# Patient Record
Sex: Female | Born: 1965 | Race: White | Hispanic: No | Marital: Married | Smoking: Never smoker
Health system: Southern US, Community
[De-identification: ages and names within clinical notes are randomized; demographics above are authoritative.]

## PROBLEM LIST (undated history)

## (undated) DIAGNOSIS — K589 Irritable bowel syndrome without diarrhea: Secondary | ICD-10-CM

## (undated) DIAGNOSIS — D229 Melanocytic nevi, unspecified: Secondary | ICD-10-CM

## (undated) DIAGNOSIS — Z98891 History of uterine scar from previous surgery: Secondary | ICD-10-CM

## (undated) DIAGNOSIS — IMO0002 Reserved for concepts with insufficient information to code with codable children: Secondary | ICD-10-CM

## (undated) DIAGNOSIS — K219 Gastro-esophageal reflux disease without esophagitis: Secondary | ICD-10-CM

## (undated) HISTORY — DX: Irritable bowel syndrome, unspecified: K58.9

## (undated) HISTORY — PX: TONSILLECTOMY: SUR1361

## (undated) HISTORY — DX: Gastro-esophageal reflux disease without esophagitis: K21.9

## (undated) HISTORY — DX: Reserved for concepts with insufficient information to code with codable children: IMO0002

## (undated) HISTORY — PX: NASAL SEPTUM SURGERY: SHX37

## (undated) HISTORY — PX: DILATION AND CURETTAGE OF UTERUS: SHX78

---
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
## 1997-12-24 ENCOUNTER — Ambulatory Visit (HOSPITAL_COMMUNITY): Admission: RE | Admit: 1997-12-24 | Discharge: 1997-12-24 | Payer: Self-pay | Admitting: Obstetrics and Gynecology

## 1998-02-09 ENCOUNTER — Ambulatory Visit (HOSPITAL_COMMUNITY): Admission: RE | Admit: 1998-02-09 | Discharge: 1998-02-09 | Payer: Self-pay | Admitting: Obstetrics and Gynecology

## 1998-02-11 ENCOUNTER — Inpatient Hospital Stay (HOSPITAL_COMMUNITY): Admission: AD | Admit: 1998-02-11 | Discharge: 1998-02-11 | Payer: Self-pay | Admitting: Obstetrics and Gynecology

## 1998-02-13 ENCOUNTER — Inpatient Hospital Stay (HOSPITAL_COMMUNITY): Admission: AD | Admit: 1998-02-13 | Discharge: 1998-02-13 | Payer: Self-pay | Admitting: Obstetrics and Gynecology

## 1998-02-17 ENCOUNTER — Encounter (HOSPITAL_COMMUNITY): Admission: RE | Admit: 1998-02-17 | Discharge: 1998-04-03 | Payer: Self-pay | Admitting: Obstetrics and Gynecology

## 1998-02-24 ENCOUNTER — Inpatient Hospital Stay (HOSPITAL_COMMUNITY): Admission: AD | Admit: 1998-02-24 | Discharge: 1998-02-24 | Payer: Self-pay | Admitting: Obstetrics and Gynecology

## 1998-04-01 ENCOUNTER — Inpatient Hospital Stay (HOSPITAL_COMMUNITY): Admission: AD | Admit: 1998-04-01 | Discharge: 1998-04-06 | Payer: Self-pay | Admitting: Obstetrics and Gynecology

## 1998-10-23 ENCOUNTER — Other Ambulatory Visit: Admission: RE | Admit: 1998-10-23 | Discharge: 1998-10-23 | Payer: Self-pay | Admitting: Obstetrics and Gynecology

## 2000-01-25 ENCOUNTER — Other Ambulatory Visit: Admission: RE | Admit: 2000-01-25 | Discharge: 2000-01-25 | Payer: Self-pay | Admitting: Obstetrics and Gynecology

## 2001-02-28 ENCOUNTER — Encounter (INDEPENDENT_AMBULATORY_CARE_PROVIDER_SITE_OTHER): Payer: Self-pay | Admitting: Specialist

## 2001-02-28 ENCOUNTER — Ambulatory Visit (HOSPITAL_COMMUNITY): Admission: RE | Admit: 2001-02-28 | Discharge: 2001-02-28 | Payer: Self-pay | Admitting: Obstetrics and Gynecology

## 2001-03-07 ENCOUNTER — Ambulatory Visit (HOSPITAL_COMMUNITY): Admission: RE | Admit: 2001-03-07 | Discharge: 2001-03-07 | Payer: Self-pay | Admitting: *Deleted

## 2001-03-07 ENCOUNTER — Encounter: Payer: Self-pay | Admitting: *Deleted

## 2002-11-19 ENCOUNTER — Encounter: Payer: Self-pay | Admitting: Internal Medicine

## 2002-11-19 DIAGNOSIS — K644 Residual hemorrhoidal skin tags: Secondary | ICD-10-CM | POA: Insufficient documentation

## 2003-01-13 ENCOUNTER — Encounter: Admission: RE | Admit: 2003-01-13 | Discharge: 2003-01-13 | Payer: Self-pay | Admitting: Internal Medicine

## 2003-01-13 ENCOUNTER — Encounter: Payer: Self-pay | Admitting: Internal Medicine

## 2004-06-15 ENCOUNTER — Ambulatory Visit: Payer: Self-pay | Admitting: Internal Medicine

## 2004-06-17 ENCOUNTER — Ambulatory Visit (HOSPITAL_COMMUNITY): Admission: RE | Admit: 2004-06-17 | Discharge: 2004-06-17 | Payer: Self-pay | Admitting: Internal Medicine

## 2004-06-22 ENCOUNTER — Ambulatory Visit: Payer: Self-pay | Admitting: Internal Medicine

## 2004-06-29 ENCOUNTER — Ambulatory Visit (HOSPITAL_COMMUNITY): Admission: RE | Admit: 2004-06-29 | Discharge: 2004-06-29 | Payer: Self-pay | Admitting: Obstetrics and Gynecology

## 2004-07-15 ENCOUNTER — Ambulatory Visit: Payer: Self-pay | Admitting: Internal Medicine

## 2004-07-28 ENCOUNTER — Ambulatory Visit: Payer: Self-pay | Admitting: Internal Medicine

## 2004-09-19 DIAGNOSIS — K219 Gastro-esophageal reflux disease without esophagitis: Secondary | ICD-10-CM | POA: Insufficient documentation

## 2004-09-23 ENCOUNTER — Ambulatory Visit: Payer: Self-pay | Admitting: Internal Medicine

## 2004-12-31 ENCOUNTER — Ambulatory Visit: Payer: Self-pay | Admitting: Internal Medicine

## 2005-01-06 ENCOUNTER — Ambulatory Visit: Payer: Self-pay | Admitting: Internal Medicine

## 2005-04-28 ENCOUNTER — Ambulatory Visit: Payer: Self-pay | Admitting: Internal Medicine

## 2005-05-09 ENCOUNTER — Ambulatory Visit: Payer: Self-pay | Admitting: Internal Medicine

## 2005-08-15 ENCOUNTER — Ambulatory Visit: Payer: Self-pay | Admitting: Internal Medicine

## 2007-04-18 ENCOUNTER — Ambulatory Visit: Payer: Self-pay | Admitting: Internal Medicine

## 2007-04-18 DIAGNOSIS — K589 Irritable bowel syndrome without diarrhea: Secondary | ICD-10-CM | POA: Insufficient documentation

## 2007-06-07 ENCOUNTER — Ambulatory Visit: Payer: Self-pay | Admitting: Internal Medicine

## 2007-07-19 DIAGNOSIS — IMO0002 Reserved for concepts with insufficient information to code with codable children: Secondary | ICD-10-CM

## 2007-07-19 DIAGNOSIS — R87619 Unspecified abnormal cytological findings in specimens from cervix uteri: Secondary | ICD-10-CM

## 2007-07-19 HISTORY — DX: Reserved for concepts with insufficient information to code with codable children: IMO0002

## 2007-07-19 HISTORY — DX: Unspecified abnormal cytological findings in specimens from cervix uteri: R87.619

## 2007-09-06 DIAGNOSIS — J301 Allergic rhinitis due to pollen: Secondary | ICD-10-CM | POA: Insufficient documentation

## 2007-09-06 DIAGNOSIS — J45909 Unspecified asthma, uncomplicated: Secondary | ICD-10-CM | POA: Insufficient documentation

## 2007-09-06 DIAGNOSIS — K5909 Other constipation: Secondary | ICD-10-CM | POA: Insufficient documentation

## 2007-09-06 DIAGNOSIS — R1013 Epigastric pain: Secondary | ICD-10-CM | POA: Insufficient documentation

## 2007-09-06 DIAGNOSIS — R12 Heartburn: Secondary | ICD-10-CM | POA: Insufficient documentation

## 2007-09-06 DIAGNOSIS — F411 Generalized anxiety disorder: Secondary | ICD-10-CM | POA: Insufficient documentation

## 2007-09-06 DIAGNOSIS — M2550 Pain in unspecified joint: Secondary | ICD-10-CM | POA: Insufficient documentation

## 2007-10-17 HISTORY — PX: CYSTOSCOPY: SUR368

## 2007-11-15 ENCOUNTER — Other Ambulatory Visit: Admission: RE | Admit: 2007-11-15 | Discharge: 2007-11-15 | Payer: Self-pay | Admitting: Obstetrics & Gynecology

## 2007-12-14 ENCOUNTER — Encounter: Admission: RE | Admit: 2007-12-14 | Discharge: 2007-12-14 | Payer: Self-pay | Admitting: Gastroenterology

## 2008-02-13 ENCOUNTER — Other Ambulatory Visit: Admission: RE | Admit: 2008-02-13 | Discharge: 2008-02-13 | Payer: Self-pay | Admitting: Obstetrics & Gynecology

## 2008-05-13 ENCOUNTER — Other Ambulatory Visit: Admission: RE | Admit: 2008-05-13 | Discharge: 2008-05-13 | Payer: Self-pay | Admitting: Obstetrics & Gynecology

## 2008-08-14 ENCOUNTER — Other Ambulatory Visit: Admission: RE | Admit: 2008-08-14 | Discharge: 2008-08-14 | Payer: Self-pay | Admitting: Obstetrics & Gynecology

## 2009-01-28 DIAGNOSIS — D229 Melanocytic nevi, unspecified: Secondary | ICD-10-CM

## 2009-01-28 HISTORY — DX: Melanocytic nevi, unspecified: D22.9

## 2010-11-30 NOTE — Assessment & Plan Note (Signed)
[facility]                         GASTROENTEROLOGY OFFICE NOTE   NAME:Laurie Flores, Laurie Flores                      MRN:          [id number]  DATE:06/07/2007                            DOB:          26-Apr-1966    CHIEF COMPLAINT:  Follow up of gastroesophageal reflux disease and  epigastric pain.   She is somewhat better, but really is still having problems with right  lower quadrant pain, pre and post defecation. She has been on Protonix  since last month. She still has nausea and decreased appetite. She was  talking to her primary care physician, Dr. 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 She had a CT scan at [facility]  looking for kidney stones because of some pain, and that was okay she  tells me.   VITAL SIGNS:  Her weight is stable at 132 pounds. Pulse 68, blood  pressure 96/60.   Medications, allergies, and past medical history is listed, reviewed,  and unchanged. She is now on Pantoprazole once daily. This was added  last month.   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 Irritable bowel  syndrome is certainly in the background. The fact that she took a lot of  antibiotics raises the question of disruptive bacteria flora. The  Protonix is not working as well as we would like.   PLAN:  1. Align probiotic daily.  2. Zegerid twice daily, samples given. This is not on her formulary,      but we will try that and see.  3. MiraLax prescription for constipation.  4. Follow up in six weeks to reassess, sooner if needed.     Iva Boop, MD,FACG  Electronically Signed    CEG/MedQ  DD: 06/09/2007  DT: 06/10/2007  Job #: [id number]   cc:   Tammy R. Collins Scotland, M.D.

## 2010-11-30 NOTE — Assessment & Plan Note (Signed)
[facility]                         GASTROENTEROLOGY OFFICE NOTE   NAME:Laurie Flores, Laurie Flores                      MRN:          [id number]  DATE:04/18/2007                            DOB:          1965/12/26    CHIEF COMPLAINT:  Epigastric pain.   I have not seen Laurie Flores in some time.  She had been followed for irritable  bowel, mainly constipation, predominant, as well as heartburn type  symptoms and had done well on Aciphex.  She came off of her Aciphex due  to formulary issues and for the past six months she has had fairly  intense epigastric pain and burning chest pain consistent with reflux  though perhaps a little more intense.  She has been using some over the  counter Prilosec without significant relief.  She had come off of her  Aciphex for awhile.  Her bowel movements are doing pretty well, though  she has to occasionally use MiraLax.  There is no weight loss, vomiting,  or other situational stressors or anything affecting this.  Her caffeine  intake is minimal.  She has some nausea at times and she belches with a  bad taste in her mouth as well.  She has modified her diet and that  seems to help her irritable bowel, eating smaller meals, etc.   PAST MEDICAL HISTORY:  1. Irritable bowel syndrome.  Workup with EGD and flexible      sigmoidoscopy, May 2004, with a normal EGD.  External hemorrhoids      on flexible sigmoidoscopy.  2. Arthralgia problems in the past.  3. Anxiety.  4. Allergies.   MEDICATIONS:  1. Trivora daily.  2. Astelin two puffs b.i.d.  3. Nasonex two puffs daily.  4. Zyrtec daily.  5. Multivitamin daily.  6. Prilosec b.i.d.   ALLERGIES:  1. She is allergic to SULFA.  2. SHELLFISH.   PHYSICAL EXAMINATION:  Weight 137 pounds, pulse 60, blood pressure  102/62.  LUNGS:  Clear.  HEART:  S1/S2.  No murmurs, rubs, or gallops.  ABDOMEN:  Soft with mild epigastric tenderness.  No organomegaly or  masses.  The xiphoid is  slightly tender as well.  EXTREMITIES:  Lower extremities free of edema.  NEUROLOGIC:  She is alert and oriented x3.   ASSESSMENT:  1. I suspect this is an exacerbation of gastroesophageal reflux      disease.  2. She also has functional bowel problems, i.e., irritable bowel.      Could be functional dyspepsia type problem as well.   PLAN:  1. Discontinue Prilosec.  2. Start Pantoprazole 40 mg b.i.d.  3. Samples of Protonix given so that she can take this b.i.d. for a      month and then try to go to daily.  If this does not work she is to      call me back.  Could consider symptomatic therapy with GI cocktail      versus Carafate.  Other irritable bowel agents could be tried.      This does not sound like her gallbladder, but the possibility of  abdominal ultrasound will be kept in mind depending upon response      to this treatment.     Iva Boop, MD,FACG  Electronically Signed    CEG/MedQ  DD: 04/18/2007  DT: 04/19/2007  Job #: [id number]   cc:   Tammy R. Collins Scotland, M.D.

## 2010-12-03 NOTE — Op Note (Signed)
[facility]  Patient:    Laurie Flores, Laurie Flores                      MRN: [id number] Proc. Date: 02/28/01 Adm. Date:  [id number] Attending:  Malon Kindle                           Operative Report  PREOPERATIVE DIAGNOSIS:       Nonviable intrauterine pregnancy.  POSTOPERATIVE DIAGNOSIS:      Probable incomplete abortion.  OPERATION:                    Suction dilatation and curettage.  SURGEON:                      Malachi Pro. Ambrose Mantle, M.D.  ANESTHESIA:                   1% Xylocaine cervical block, 7 cc.  DESCRIPTION OF PROCEDURE:     The patient was brought to the operating room and placed in the [place] stirrups.  Exam revealed the uterus to be anterior, upper limit of normal size.  The adnexa were free of masses.  Speculum was inserted.  The cervix was prepped with Betadine solution; 7 cc of 1% Xylocaine was divided between 12, 4, and 8 oclock.  The cervix was easily dilated to a 27 Pratt dilator.  A #8 curved suction curet was inserted, and a suction D&C was done producing 1 plug about 2 x 2 cm of tissue which was probably either decidualized endometrium or decidualized endometrium and some placenta.  After all the tissue had been removed, the sharp curet was used to ensure that the cavity felt smooth.  The tissue was inspected showed exactly what I described. The procedure was terminated, and the patient was returned to recovery in satisfactory condition.  her blood type is B positive.  Based on the findings, it appears that the patients heavy bleeding had indicated an incomplete abortion, and probably she has had an incomplete abortion rather than a nonviable intrauterine pregnancy. DD:  02/28/01 TD:  02/28/01 Job: [id number]

## 2011-02-17 ENCOUNTER — Encounter (HOSPITAL_COMMUNITY): Payer: Self-pay

## 2011-03-04 ENCOUNTER — Ambulatory Visit (HOSPITAL_COMMUNITY)
Admission: RE | Admit: 2011-03-04 | Discharge: 2011-03-04 | Disposition: A | Discharge status: Home / Self Care | Payer: Managed Care, Other (non HMO) | Source: Ambulatory Visit | Attending: Internal Medicine | Admitting: Internal Medicine

## 2011-03-04 DIAGNOSIS — R0602 Shortness of breath: Secondary | ICD-10-CM | POA: Insufficient documentation

## 2011-03-04 DIAGNOSIS — R059 Cough, unspecified: Secondary | ICD-10-CM | POA: Insufficient documentation

## 2011-03-04 DIAGNOSIS — R05 Cough: Secondary | ICD-10-CM | POA: Insufficient documentation

## 2012-01-23 ENCOUNTER — Other Ambulatory Visit (INDEPENDENT_AMBULATORY_CARE_PROVIDER_SITE_OTHER): Payer: Self-pay | Admitting: Otolaryngology

## 2012-01-23 DIAGNOSIS — J320 Chronic maxillary sinusitis: Secondary | ICD-10-CM

## 2012-01-26 ENCOUNTER — Ambulatory Visit
Admission: RE | Admit: 2012-01-26 | Discharge: 2012-01-26 | Disposition: A | Discharge status: Home / Self Care | Payer: Managed Care, Other (non HMO) | Source: Ambulatory Visit | Attending: Otolaryngology | Admitting: Otolaryngology

## 2012-01-26 DIAGNOSIS — J320 Chronic maxillary sinusitis: Secondary | ICD-10-CM

## 2012-04-18 ENCOUNTER — Other Ambulatory Visit: Payer: Self-pay | Admitting: Internal Medicine

## 2012-04-18 DIAGNOSIS — R1011 Right upper quadrant pain: Secondary | ICD-10-CM

## 2012-04-23 ENCOUNTER — Ambulatory Visit
Admission: RE | Admit: 2012-04-23 | Discharge: 2012-04-23 | Disposition: A | Discharge status: Home / Self Care | Payer: Managed Care, Other (non HMO) | Source: Ambulatory Visit | Attending: Internal Medicine | Admitting: Internal Medicine

## 2012-04-23 DIAGNOSIS — R1011 Right upper quadrant pain: Secondary | ICD-10-CM

## 2012-04-23 HISTORY — DX: History of uterine scar from previous surgery: Z98.891

## 2012-05-04 ENCOUNTER — Telehealth: Payer: Self-pay | Admitting: Internal Medicine

## 2012-05-04 NOTE — Telephone Encounter (Signed)
Patient had history with you in 2004.  She was seen by J. Medoff in 2009.  She states she changed for insurance reasons.  Dr. Leone Payor are you willing to see her again?

## 2012-05-04 NOTE — Telephone Encounter (Signed)
I saw her in 2008 also  Ok to see her but not until we have Medoff records in hand

## 2012-05-04 NOTE — Telephone Encounter (Signed)
I am unable to leave a message her voicemail is full.  I will continue to try and reach the patient

## 2012-05-07 NOTE — Telephone Encounter (Signed)
Patient advised.  I have asked that she get the records to my attention and as soon as she does.  I will contact her about setting up an appt with Dr. Leone Payor.  She states she will work on getting records and contact me back

## 2013-04-11 ENCOUNTER — Ambulatory Visit: Payer: Self-pay | Admitting: Obstetrics & Gynecology

## 2013-04-24 ENCOUNTER — Encounter: Payer: Self-pay | Admitting: Obstetrics & Gynecology

## 2013-04-25 ENCOUNTER — Encounter: Payer: Self-pay | Admitting: Obstetrics & Gynecology

## 2013-04-25 ENCOUNTER — Ambulatory Visit (INDEPENDENT_AMBULATORY_CARE_PROVIDER_SITE_OTHER): Payer: BC Managed Care – PPO | Admitting: Obstetrics & Gynecology

## 2013-04-25 VITALS — BP 116/74 | HR 60 | Resp 12 | Ht 64.25 in | Wt 136.6 lb

## 2013-04-25 DIAGNOSIS — Z124 Encounter for screening for malignant neoplasm of cervix: Secondary | ICD-10-CM

## 2013-04-25 DIAGNOSIS — N925 Other specified irregular menstruation: Secondary | ICD-10-CM

## 2013-04-25 DIAGNOSIS — Z0000 Encounter for general adult medical examination without abnormal findings: Secondary | ICD-10-CM

## 2013-04-25 DIAGNOSIS — N949 Unspecified condition associated with female genital organs and menstrual cycle: Secondary | ICD-10-CM

## 2013-04-25 DIAGNOSIS — N938 Other specified abnormal uterine and vaginal bleeding: Secondary | ICD-10-CM

## 2013-04-25 DIAGNOSIS — Z01419 Encounter for gynecological examination (general) (routine) without abnormal findings: Secondary | ICD-10-CM

## 2013-04-25 LAB — POCT URINALYSIS DIPSTICK
Blood, UA: NEGATIVE
Glucose, UA: NEGATIVE
Nitrite, UA: NEGATIVE
Protein, UA: NEGATIVE
Urobilinogen, UA: NEGATIVE
pH, UA: 5

## 2013-04-25 MED ORDER — NORETHIN ACE-ETH ESTRAD-FE 1-20 MG-MCG PO TABS: 1.0000 | ORAL_TABLET | Freq: Every day | ORAL | Status: DC

## 2013-04-25 NOTE — Progress Notes (Signed)
47 y.o. Laurie Flores MarriedCaucasianF here for annual exam.  Doing well.  Cycles have changed since being on pill.  When bleeds, it is just light spotting/staining.  Many women in family have gone through menopause early.  Having more allergies this year.  Asthma has been some worse this year and Dr. Ricki Nannie Starzyk is trying to help her figure out if there is any particular cause.  Had colonoscopy at [facility] in [place], [place].     Patient's last menstrual period was 04/10/2013.          Sexually active: yes  The current method of family planning is OCP (estrogen/progesterone).    Exercising: yes  walking Smoker:  no  Health Maintenance: Pap:  03/21/12 WNL History of abnormal Pap:  yes MMG:  03/15/13 normal Colonoscopy:  2014, Repeat in 10 years BMD:   none TDaP:   Up to date-seeing Dr Ricki Anieya Helman today Screening Labs: PCP, Hb today: PCP, Urine today: negative   reports that she has never smoked. She has never used smokeless tobacco. She reports that she does not drink alcohol or use illicit drugs.  Past Medical History  Diagnosis Date  . H/O: C-section   . GERD (gastroesophageal reflux disease)     Past Surgical History  Procedure Laterality Date  . Nasal septum surgery    . Tonsillectomy    . Dilation and curettage of uterus    . Cesarean section      Current Outpatient Prescriptions  Medication Sig Dispense Refill  . Azelastine HCl (ASTELIN NA) Place into the nose daily.       . Cholecalciferol (VITAMIN D PO) Take 4,000 Units by mouth daily.       . fexofenadine (ALLEGRA) 180 MG tablet Take 180 mg by mouth daily.      . fluticasone (FLONASE) 50 MCG/ACT nasal spray Place 2 sprays into the nose daily.      . Multiple Vitamin (MULTI VITAMIN DAILY PO) Take by mouth daily.      . Norethin Ace-Eth Estrad-FE (LOESTRIN FE 1/20 PO) Take by mouth daily.      . Omeprazole (PRILOSEC PO) Take by mouth as needed.      . Probiotic Product (SOLUBLE FIBER/PROBIOTICS PO) Take by mouth daily.       No  current facility-administered medications for this visit.    Family History  Problem Relation Age of Onset  . Hypertension Mother   . Osteoporosis Mother   . Hypertension Father   . Heart disease Father   . Osteoporosis Sister   . Breast cancer Maternal Grandmother   . Rheum arthritis Sister     age 72  . Arthritis Sister     central spinal cord syndrome age 39  . Colon polyps Mother     ROS:  Pertinent items are noted in HPI.  Otherwise, a comprehensive ROS was negative.  Exam:   BP 116/74  Pulse 60  Resp 12  Ht 5' 4.25" (1.632 m)  Wt 136 lb 9.6 oz (61.961 kg)  BMI 23.26 kg/m2  LMP 04/10/2013  Weight change: -6lb (due to dietary changes)  Height: 5' 4.25" (163.2 cm)  Ht Readings from Last 3 Encounters:  04/25/13 5' 4.25" (1.632 m)    General appearance: alert, cooperative and appears stated age Head: Normocephalic, without obvious abnormality, atraumatic Neck: no adenopathy, supple, symmetrical, trachea midline and thyroid normal to inspection and palpation Lungs: clear to auscultation bilaterally Breasts: normal appearance, no masses or tenderness Heart: regular rate and rhythm Abdomen:  soft, non-tender; bowel sounds normal; no masses,  no organomegaly Extremities: extremities normal, atraumatic, no cyanosis or edema Skin: Skin color, texture, turgor normal. No rashes or lesions Lymph nodes: Cervical, supraclavicular, and axillary nodes normal. No abnormal inguinal nodes palpated Neurologic: Grossly normal   Pelvic: External genitalia:  no lesions              Urethra:  normal appearing urethra with no masses, tenderness or lesions              Bartholins and Skenes: normal                 Vagina: normal appearing vagina with normal color and discharge, no lesions              Cervix: no lesions              Pap taken: yes Bimanual Exam:  Uterus:  normal size, contour, position, consistency, mobility, non-tender              Adnexa: normal adnexa                Rectovaginal: Confirms               Anus:  normal sphincter tone, no lesions  A:  Well Woman with normal exam Minimal bleeding on OCPs Increased GI/GERD issues this year.  Had colonoscopy 4/14 at [facility]. Moles.  Sees Derm yearly.    P:   Mammogram yearly. Loestrin rx to pharmacy for 1 year FSH pap smear only today.  H/O AGUS Pap. return annually or prn  An After Visit Summary was printed and given to the patient.

## 2013-05-02 ENCOUNTER — Telehealth: Payer: Self-pay

## 2013-05-02 NOTE — Telephone Encounter (Signed)
Message copied by Elisha Headland on Thu May 02, 2013  4:58 PM ------      Message from: Jerene Bears      Created: Tue Apr 30, 2013  6:09 AM       Inform [facility]SH in nl range.  Continue on OCPs.  Pap normal.  F/U one year.  H/o AGUS.  Does not need to be in any recall except 02. ------

## 2013-05-02 NOTE — Telephone Encounter (Signed)
Lmtcb//kn 

## 2013-05-06 NOTE — Telephone Encounter (Signed)
Returning a call to Kelly °

## 2013-05-09 NOTE — Telephone Encounter (Signed)
Returning call.

## 2013-05-14 NOTE — Telephone Encounter (Signed)
Patient aware of lab results.

## 2014-05-09 ENCOUNTER — Ambulatory Visit (INDEPENDENT_AMBULATORY_CARE_PROVIDER_SITE_OTHER): Payer: BC Managed Care – PPO | Admitting: Nurse Practitioner

## 2014-05-09 ENCOUNTER — Encounter: Payer: Self-pay | Admitting: Nurse Practitioner

## 2014-05-09 VITALS — BP 112/70 | HR 68 | Ht 64.75 in | Wt 144.0 lb

## 2014-05-09 DIAGNOSIS — Z01419 Encounter for gynecological examination (general) (routine) without abnormal findings: Secondary | ICD-10-CM

## 2014-05-09 DIAGNOSIS — Z0000 Encounter for general adult medical examination without abnormal findings: Secondary | ICD-10-CM

## 2014-05-09 MED ORDER — NORETHIN ACE-ETH ESTRAD-FE 1-20 MG-MCG PO TABS: 1.0000 | ORAL_TABLET | Freq: Every day | ORAL | Status: DC

## 2014-05-09 NOTE — Progress Notes (Signed)
Patient ID: Laurie Flores, female   DOB: Feb 07, 1966, 48 y.o.   MRN: [id number] 48 y.o. [id number] Married Caucasian Fe here for annual exam.  Menses on OCP is spotting to light vaginal discharge with wiping.  Gets some PMS and breast tenderness with lower abdominal cramps during off week.   Patient's last menstrual period was 04/29/2014.          Sexually active: Yes.    The current method of family planning is OCP (estrogen/progesterone).    Exercising: Yes.    walking Smoker:  no  Health Maintenance: Pap:  04/25/13, WNL  (AGUS pap 07/2007) MMG:  04/04/14, Bi-Rads 1:  Negative  Colonoscopy:  2014, repeat in 7 years (mother with history of colon polyps) BMD:   Never  TDaP:  Updated with PCP Labs:  PCP   reports that she has never smoked. She has never used smokeless tobacco. She reports that she does not drink alcohol or use illicit drugs.  Past Medical History  Diagnosis Date  . H/O: C-section   . GERD (gastroesophageal reflux disease)   . Abnormal Pap smear 1/09    AGUS pap  . IBS (irritable bowel syndrome)     ?    Past Surgical History  Procedure Laterality Date  . Nasal septum surgery    . Tonsillectomy    . Dilation and curettage of uterus    . Cesarean section    . Cystoscopy  4/09    in office, secondary to microscopic hematuria    Current Outpatient Prescriptions  Medication Sig Dispense Refill  . Azelastine HCl (ASTELIN NA) Place into the nose daily.       . budesonide (PULMICORT) 180 MCG/ACT inhaler Inhale 1 puff into the lungs daily.      . Cholecalciferol (VITAMIN D PO) Take 4,000 Units by mouth daily.       . fexofenadine (ALLEGRA) 180 MG tablet Take 180 mg by mouth daily.      . fluticasone (FLONASE) 50 MCG/ACT nasal spray Place 2 sprays into the nose daily.      . Magnesium 200 MG TABS Take 200 mg by mouth daily.      . norethindrone-ethinyl estradiol (LOESTRIN FE 1/20) 1-20 MG-MCG tablet Take 1 tablet by mouth daily.  3 Package  4  . Omeprazole (PRILOSEC PO)  Take 1 tablet by mouth daily.       . Probiotic Product (SOLUBLE FIBER/PROBIOTICS PO) Take by mouth daily.       No current facility-administered medications for this visit.    Family History  Problem Relation Age of Onset  . Hypertension Mother   . Osteoporosis Mother   . Hypertension Father   . Heart disease Father   . Osteoporosis Sister   . Breast cancer Maternal Grandmother   . Rheum arthritis Sister     age 14  . Arthritis Sister     central spinal cord syndrome age 70  . Colon polyps Mother     ROS:  Pertinent items are noted in HPI.  Otherwise, a comprehensive ROS was negative.  Exam:   BP 112/70  Pulse 68  Ht 5' 4.75" (1.645 m)  Wt 144 lb (65.318 kg)  BMI 24.14 kg/m2  LMP 04/29/2014 Height: 5' 4.75" (164.5 cm)  Ht Readings from Last 3 Encounters:  05/09/14 5' 4.75" (1.645 m)  04/25/13 5' 4.25" (1.632 m)    General appearance: alert, cooperative and appears stated age Head: Normocephalic, without obvious abnormality, atraumatic Neck: no adenopathy,  supple, symmetrical, trachea midline and thyroid normal to inspection and palpation Lungs: clear to auscultation bilaterally Breasts: normal appearance, no masses or tenderness Heart: regular rate and rhythm Abdomen: soft, non-tender; no masses,  no organomegaly Extremities: extremities normal, atraumatic, no cyanosis or edema Skin: Skin color, texture, turgor normal. No rashes or lesions Lymph nodes: Cervical, supraclavicular, and axillary nodes normal. No abnormal inguinal nodes palpated Neurologic: Grossly normal   Pelvic: External genitalia:  no lesions              Urethra:  normal appearing urethra with no masses, tenderness or lesions              Bartholin's and Skene's: normal                 Vagina: normal appearing vagina with normal color and discharge, no lesions              Cervix: retroverted              Pap taken: Yes.   Bimanual Exam:  Uterus:  normal size, contour, position, consistency,  mobility, non-tender              Adnexa: no mass, fullness, tenderness               Rectovaginal: Confirms               Anus:  normal sphincter tone, no lesions  A:  Well Woman with normal exam  Perimenopausal symptoms  Minimal bleeding on OCPs   GERD, IBS  Moles. Sees Derm yearly.   P:   Reviewed health and wellness pertinent to exam  Pap smear taken today  Mammogram is due 9/16  Refill Loestrin 1/29 for a year - she may want to consider change to Lo Loestrin to see if PMS symptoms are better  will find out cost.  Counseled on breast self exam, mammography screening, use and side effects of OCP's, adequate intake of calcium and vitamin D, diet and exercise, Kegel's exercises return annually or prn  An After Visit Summary was printed and given to the patient.

## 2014-05-09 NOTE — Patient Instructions (Addendum)

## 2014-05-12 NOTE — Progress Notes (Signed)
Encounter reviewed by Dr. Brook Silva.  

## 2014-05-13 ENCOUNTER — Ambulatory Visit: Payer: BC Managed Care – PPO | Admitting: Obstetrics & Gynecology

## 2014-05-14 LAB — IPS PAP TEST WITH HPV

## 2014-05-19 ENCOUNTER — Encounter: Payer: Self-pay | Admitting: Nurse Practitioner

## 2014-06-03 ENCOUNTER — Telehealth: Payer: Self-pay | Admitting: Nurse Practitioner

## 2014-06-03 ENCOUNTER — Telehealth: Payer: Self-pay

## 2014-06-03 MED ORDER — NORETHIN-ETH ESTRAD-FE BIPHAS 1 MG-10 MCG / 10 MCG PO TABS: 1.0000 | ORAL_TABLET | Freq: Every day | ORAL | Status: DC

## 2014-06-03 NOTE — Telephone Encounter (Signed)
Patient has some questions about her RX for:  norethindrone-ethinyl estradiol (LOESTRIN FE 1/20) 1-20 MG-MCG tablet  Take 1 tablet by mouth daily., Starting 05/09/2014, Until Discontinued, Normal, Last Dose: Not Recorded  Refills: 4 ordered Pharmacy: [facility], [place]

## 2014-06-03 NOTE — Telephone Encounter (Signed)
Spoke with patient. Patient states that she spoke with insurance company and was told that they would cover Loestrin 1.5/30. "I can't remember if this is what Chong Sicilian was recommending or that the name was of the medication she was thinking about switching me to." Advised per OV note Milford Cage, FNP would like to switch her over to Lo Loestrin Fe if this is covered by her insurance. Patient is agreeable and will call insurance and call back if this is covered so that we can make the change. Patient is currently on Loestrin Fe 1/20 and was given refills until next aex just in case Lo Loestrin Fe is not covered with her insurance.  Routing to provider for final review. Patient agreeable to disposition. Will close encounter.

## 2014-06-03 NOTE — Telephone Encounter (Signed)
Left message to call Kaitlyn at [phone]. 

## 2014-06-03 NOTE — Telephone Encounter (Signed)
Spoke with patient at time of incoming call. Patient states that she spoke with insurance company and "I am pretty sure Lo Loestrin is covered." Patient would like to have prescription switched at this time. Order placed for Lo Loestrin Fe to mail order pharmacy on file. Other rx for Loestrin discontinued. Please see OV note from Milford Cage, FNP below regarding this switch. Also see previous telephone note from patient.   P: Reviewed health and wellness pertinent to exam Pap smear taken today Mammogram is due 9/16 Refill Loestrin 1/29 for a year - she may want to consider change to Lo Loestrin to see if PMS symptoms are better will find out cost.

## 2014-06-30 ENCOUNTER — Other Ambulatory Visit: Payer: Self-pay | Admitting: Internal Medicine

## 2014-06-30 DIAGNOSIS — J329 Chronic sinusitis, unspecified: Secondary | ICD-10-CM

## 2014-07-04 ENCOUNTER — Ambulatory Visit
Admission: RE | Admit: 2014-07-04 | Discharge: 2014-07-04 | Disposition: A | Discharge status: Home / Self Care | Payer: BC Managed Care – PPO | Source: Ambulatory Visit | Attending: Internal Medicine | Admitting: Internal Medicine

## 2014-07-04 DIAGNOSIS — J329 Chronic sinusitis, unspecified: Secondary | ICD-10-CM

## 2014-08-06 ENCOUNTER — Telehealth: Payer: Self-pay | Admitting: Obstetrics & Gynecology

## 2014-08-06 NOTE — Telephone Encounter (Signed)
Spoke with patient. Patient states that she is unsure what she would like to do at this time. "I was having some problems on the other prescription which is why I switched. Maybe I just need to give this one more time." Patient would like to wait and continue taking Loloestrin for another week to see how she does. If she desires a change will return call to office. Advised patient if symptoms worsen or she develops new symptoms will need to call office and stop taking Loloestrin. If anything occurs over the weekend stop taking Loloestrin and see urgent care. Patient is agreeable and verbalizes understanding.  Routing to provider for final review. Patient agreeable to disposition. Will close encounter

## 2014-08-06 NOTE — Telephone Encounter (Signed)
Patty switched pt due to having some PMS symptoms.  OK to switch back to loestrin 1/20.  If PMS symptoms are bothersome for her, can also consider Micronor.  Either is ok.  Please let me know what pt desires before encounter closed.  Thanks.

## 2014-08-06 NOTE — Telephone Encounter (Signed)
Spoke with patient. Patient states that she was changed from Loestrin to Lo Loestrin. Has been taking for 2-3 weeks now. Patient states that she has been experiencing a red, itchy rash on the back of her arms and on her upper back. "I noticed it a couple of days after I started taking the medication. I thought it may be the weather so I didn't want to call too soon but it is not going away. I notice it more after I take the medication in the morning." Has also been increasingly moody with new medication. Denies any other symptoms such as hives, swelling, and shortness of breath. Advised patient will need to speak with Dr.Miller regarding symptoms and return call with further recommendations. Patient is agreeable.

## 2014-08-06 NOTE — Telephone Encounter (Signed)
Patient calling requesting to speak with the nurse about her birth control.

## 2014-08-07 ENCOUNTER — Telehealth: Payer: Self-pay | Admitting: Obstetrics & Gynecology

## 2014-08-07 MED ORDER — NORETHINDRONE 0.35 MG PO TABS: 1.0000 | ORAL_TABLET | Freq: Every day | ORAL | Status: DC

## 2014-08-07 NOTE — Telephone Encounter (Signed)
Pt calling to speak with nurse about her medication.

## 2014-08-07 NOTE — Telephone Encounter (Signed)
Spoke with patient. Patient states that she is returning call regarding getting started on Micronor. Please see phone call from 08/06/14. Patient would like to go ahead and switch from taking Lo Loestrin Fe to Micronor at this time. States her local pharmacy will only cover 1 month before she needs refills at [facility]. Patient requests one pack be sent to [facility] on file and she will call back after being on it for one month for refills. "I want to try it for a month first to make sure it works well for me." Micronor #1 [id number] sent to [facility]. Advised to take pills at the same time daily and not to skip any pills. Will need to use BUM for first month. Patient is agreeable.  Routing to provider for final review. Patient agreeable to disposition. Will close encounter

## 2014-08-11 ENCOUNTER — Telehealth: Payer: Self-pay | Admitting: Obstetrics & Gynecology

## 2014-08-11 NOTE — Telephone Encounter (Signed)
Spoke with patient. Advised patient of message as seen below from Dr.Miller. Patient is agreeable and verbalizes understanding.  Routing to provider for final review. Patient agreeable to disposition. Will close encounter   

## 2014-08-11 NOTE — Telephone Encounter (Signed)
Spoke with patient. Patient states that she stopped taking her Loloestrin on Saturday. "I have not been itching since I stopped taking it. I was having some discomfort in both of my legs Saturday. I would randomly have some shooting pain in my left leg. I have not started taking the micronor because I read on the box that if you have asthma you need to take to your doctor first. It says it can make your secretions thicker and I already have a problem with that." Denies any leg pain or discomfort at this time. Advised will need to speak with Dr.Miller regarding starting Micronor and return call with further recommendations. Patient is agreeable.

## 2014-08-11 NOTE — Telephone Encounter (Signed)
I think this is fine for her to start.  If she has worsening asthma, she should call.

## 2014-08-11 NOTE — Telephone Encounter (Signed)
Patient calling requesting to speak with the nurse about her birth control and the side effects.

## 2014-08-13 ENCOUNTER — Telehealth: Payer: Self-pay | Admitting: Obstetrics & Gynecology

## 2014-08-13 NOTE — Telephone Encounter (Signed)
Spoke with patient. Patient states that she started taking Micronor on 08/11/14. "The first day I took the pill I had the worst headache I have ever had in my life. I could not even function. Then yesterday when I took the pill thirty minutes later my head started to hurt again. I want to just stop taking them for a while and use a back up method and see how I do. I have not really been having cycles lately. I just want to see how it goes." Advised patient that this is fine. Okay to come off of birth control. Needs to use BUM. Monitor cycles and symptoms with cycles and if any problems to give our office a call. Patient is agreeable.  Routing to provider for final review. Patient agreeable to disposition. Will close encounter

## 2014-08-13 NOTE — Telephone Encounter (Signed)
Patient is having problems with her current birth control. "headaches and vomiting". Last seen 05/09/14.

## 2015-05-15 ENCOUNTER — Ambulatory Visit (INDEPENDENT_AMBULATORY_CARE_PROVIDER_SITE_OTHER): Payer: BLUE CROSS/BLUE SHIELD | Admitting: Nurse Practitioner

## 2015-05-15 ENCOUNTER — Encounter: Payer: Self-pay | Admitting: Nurse Practitioner

## 2015-05-15 VITALS — BP 120/80 | HR 68 | Resp 12 | Ht 64.5 in | Wt 137.0 lb

## 2015-05-15 DIAGNOSIS — Z01419 Encounter for gynecological examination (general) (routine) without abnormal findings: Secondary | ICD-10-CM | POA: Diagnosis not present

## 2015-05-15 DIAGNOSIS — N6320 Unspecified lump in the left breast, unspecified quadrant: Secondary | ICD-10-CM

## 2015-05-15 DIAGNOSIS — N63 Unspecified lump in breast: Secondary | ICD-10-CM | POA: Diagnosis not present

## 2015-05-15 DIAGNOSIS — Z0000 Encounter for general adult medical examination without abnormal findings: Secondary | ICD-10-CM

## 2015-05-15 LAB — HIV ANTIBODY (ROUTINE TESTING W REFLEX): HIV 1&2 Ab, 4th Generation: NONREACTIVE

## 2015-05-15 MED ORDER — FLUCONAZOLE 150 MG PO TABS: 150.0000 mg | ORAL_TABLET | Freq: Once | ORAL | Status: DC

## 2015-05-15 NOTE — Progress Notes (Signed)
49 y.o. G72P0011 Married  Caucasian Fe here for annual exam.  She did not like Lo Loestrin and mood changes were worse.  She then went on Micronor for 2 months and did not like this pill.   Off POP since about March. Menses regular at 28-30 days, last 3-4 days.  Moderate to light.  Some cramps but does not require OTC NSAID's.  Feels better overall. Less moody.  Patient's last menstrual period was 04/28/2015.          Sexually active: Yes.    The current method of family planning is condoms sometimes.    Exercising: Yes.    walking Smoker:  no  Health Maintenance: Pap:  05/09/14 neg. HR HPV:neg MMG:  04/17/15 BIRADS0:Incomplete. Left Breast nodule. F/u 6 month. Pt knows to schedule  Colonoscopy:  2014 Repeat 7 years (family hx) TDaP:  With PCP Labs: PCP   reports that she has never smoked. She has never used smokeless tobacco. She reports that she does not drink alcohol or use illicit drugs.  Past Medical History  Diagnosis Date  . H/O: C-section   . GERD (gastroesophageal reflux disease)   . Abnormal Pap smear 1/09    AGUS pap  . IBS (irritable bowel syndrome)     ?    Past Surgical History  Procedure Laterality Date  . Nasal septum surgery    . Tonsillectomy    . Dilation and curettage of uterus    . Cesarean section    . Cystoscopy  4/09    in office, secondary to microscopic hematuria    Current Outpatient Prescriptions  Medication Sig Dispense Refill  . amoxicillin (AMOXIL) 500 MG capsule Take 500 mg by mouth 4 (four) times daily.     . Azelastine HCl (ASTELIN NA) Place into the nose daily.     . Cholecalciferol (VITAMIN D PO) Take 4,000 Units by mouth daily.     . fexofenadine (ALLEGRA) 180 MG tablet Take 180 mg by mouth daily.    . fluticasone (FLONASE) 50 MCG/ACT nasal spray Place 2 sprays into the nose daily.    . Magnesium 200 MG TABS Take 200 mg by mouth daily.    . Omeprazole (PRILOSEC PO) Take 1 tablet by mouth daily.     . Probiotic Product (SOLUBLE  FIBER/PROBIOTICS PO) Take by mouth daily.    Marland Kitchen QVAR 80 MCG/ACT inhaler 1 puff daily.    . fluconazole (DIFLUCAN) 150 MG tablet Take 1 tablet (150 mg total) by mouth once. Take one tablet.  Repeat in 48 hours if symptoms are not completely resolved. 2 tablet 0   No current facility-administered medications for this visit.    Family History  Problem Relation Age of Onset  . Hypertension Mother   . Osteoporosis Mother   . Hypertension Father   . Heart disease Father   . Osteoporosis Sister   . Breast cancer Maternal Grandmother   . Rheum arthritis Sister     age 84  . Arthritis Sister     central spinal cord syndrome age 40  . Colon polyps Mother     ROS:  Pertinent items are noted in HPI.  Otherwise, a comprehensive ROS was negative.  Exam:   BP 120/80 mmHg  Pulse 68  Resp 12  Ht 5' 4.5" (1.638 m)  Wt 137 lb (62.143 kg)  BMI 23.16 kg/m2  LMP 04/28/2015 Height: 5' 4.5" (163.8 cm) Ht Readings from Last 3 Encounters:  05/15/15 5' 4.5" (1.638 m)  05/09/14 5' 4.75" (1.645 m)  04/25/13 5' 4.25" (1.632 m)    General appearance: alert, cooperative and appears stated age Head: Normocephalic, without obvious abnormality, atraumatic Neck: no adenopathy, supple, symmetrical, trachea midline and thyroid normal to inspection and palpation Lungs: clear to auscultation bilaterally Breasts: normal appearance, no masses or tenderness Heart: regular rate and rhythm Abdomen: soft, non-tender; no masses,  no organomegaly Extremities: extremities normal, atraumatic, no cyanosis or edema Skin: Skin color, texture, turgor normal. No rashes or lesions Lymph nodes: Cervical, supraclavicular, and axillary nodes normal. No abnormal inguinal nodes palpated Neurologic: Grossly normal   Pelvic: External genitalia:  no lesions              Urethra:  normal appearing urethra with no masses, tenderness or lesions              Bartholin's and Skene's: normal                 Vagina: normal appearing  vagina with normal color and discharge, no lesions              Cervix: anteverted              Pap taken: Yes.   Bimanual Exam:  Uterus:  normal size, contour, position, consistency, mobility, non-tender              Adnexa: no mass, fullness, tenderness               Rectovaginal: Confirms               Anus:  normal sphincter tone, no lesions  Chaperone present: no  A:  Well Woman with normal exam  Perimenopausal symptoms Regular menses off OCP/POP  Condoms for birth control GERD, IBS Moles. Sees Derm yearly.    P:   Reviewed health and wellness pertinent to exam  Pap smear as above  Mammogram is due 03/2016  Counseled on breast self exam, mammography screening, adequate intake of calcium and vitamin D, diet and exercise return annually or prn  An After Visit Summary was printed and given to the patient.

## 2015-05-15 NOTE — Patient Instructions (Addendum)

## 2015-05-17 NOTE — Progress Notes (Signed)
Encounter reviewed by Dr. Aundria Rud. In mammogram recall for 6 months recheck.

## 2015-05-19 LAB — IPS PAP TEST WITH HPV

## 2015-06-03 ENCOUNTER — Telehealth: Payer: Self-pay | Admitting: Nurse Practitioner

## 2015-06-03 NOTE — Telephone Encounter (Signed)
I have attempted calling [facility] to check the status of this referral. Schedulers note the patient is not schedule and transfer my call to the new patient coordinator. I have left 2 messages with patient information and return call information. Please advise next step.

## 2015-06-03 NOTE — Telephone Encounter (Signed)
Patient checking on referral to Breast Surgeon from Ms. Patty. According to [facility] she has tried calling the office multiple times and is always being sent to voicemail. She has left them a message today. Best # to reach: (w) [phone] during the day ; (m) [phone]

## 2015-10-12 NOTE — Telephone Encounter (Signed)
Any further follow up needed becky for patient, if not can encounter be closed?

## 2016-02-25 DIAGNOSIS — R635 Abnormal weight gain: Secondary | ICD-10-CM | POA: Diagnosis not present

## 2016-02-25 DIAGNOSIS — Z0000 Encounter for general adult medical examination without abnormal findings: Secondary | ICD-10-CM | POA: Diagnosis not present

## 2016-02-25 DIAGNOSIS — M791 Myalgia: Secondary | ICD-10-CM | POA: Diagnosis not present

## 2016-02-25 DIAGNOSIS — J4530 Mild persistent asthma, uncomplicated: Secondary | ICD-10-CM | POA: Diagnosis not present

## 2016-02-25 DIAGNOSIS — E559 Vitamin D deficiency, unspecified: Secondary | ICD-10-CM | POA: Diagnosis not present

## 2016-02-25 DIAGNOSIS — K219 Gastro-esophageal reflux disease without esophagitis: Secondary | ICD-10-CM | POA: Diagnosis not present

## 2016-02-25 DIAGNOSIS — M2550 Pain in unspecified joint: Secondary | ICD-10-CM | POA: Diagnosis not present

## 2016-02-25 DIAGNOSIS — J302 Other seasonal allergic rhinitis: Secondary | ICD-10-CM | POA: Diagnosis not present

## 2016-04-14 DIAGNOSIS — Z87898 Personal history of other specified conditions: Secondary | ICD-10-CM | POA: Diagnosis not present

## 2016-04-14 DIAGNOSIS — N6002 Solitary cyst of left breast: Secondary | ICD-10-CM | POA: Diagnosis not present

## 2016-04-18 ENCOUNTER — Telehealth: Payer: Self-pay | Admitting: *Deleted

## 2016-04-18 NOTE — Telephone Encounter (Signed)
Patient is in 04 recall for 03/2016. She is needing a 6 month Left breast DX mammogram. Please contact patient and schedule -eh

## 2016-04-19 NOTE — Telephone Encounter (Signed)
Patient had U/S L Breast at [facility] on 04/14/16. Copy of results received and sent to provider to review. -sco

## 2016-04-19 NOTE — Telephone Encounter (Signed)
Also a 3D Bilateral Dx was done on 04/14/16.

## 2016-05-05 ENCOUNTER — Encounter: Payer: Self-pay | Admitting: Obstetrics & Gynecology

## 2016-05-10 DIAGNOSIS — J45909 Unspecified asthma, uncomplicated: Secondary | ICD-10-CM | POA: Diagnosis not present

## 2016-06-07 DIAGNOSIS — D492 Neoplasm of unspecified behavior of bone, soft tissue, and skin: Secondary | ICD-10-CM | POA: Diagnosis not present

## 2016-06-07 DIAGNOSIS — D239 Other benign neoplasm of skin, unspecified: Secondary | ICD-10-CM | POA: Diagnosis not present

## 2016-06-07 DIAGNOSIS — L821 Other seborrheic keratosis: Secondary | ICD-10-CM | POA: Diagnosis not present

## 2016-06-14 DIAGNOSIS — M25552 Pain in left hip: Secondary | ICD-10-CM | POA: Diagnosis not present

## 2016-06-14 DIAGNOSIS — S139XXA Sprain of joints and ligaments of unspecified parts of neck, initial encounter: Secondary | ICD-10-CM | POA: Diagnosis not present

## 2016-06-14 DIAGNOSIS — M545 Low back pain: Secondary | ICD-10-CM | POA: Diagnosis not present

## 2016-06-14 DIAGNOSIS — M25551 Pain in right hip: Secondary | ICD-10-CM | POA: Diagnosis not present

## 2016-06-24 DIAGNOSIS — J019 Acute sinusitis, unspecified: Secondary | ICD-10-CM | POA: Diagnosis not present

## 2016-06-27 DIAGNOSIS — M545 Low back pain: Secondary | ICD-10-CM | POA: Diagnosis not present

## 2016-08-25 DIAGNOSIS — R109 Unspecified abdominal pain: Secondary | ICD-10-CM | POA: Diagnosis not present

## 2016-08-25 DIAGNOSIS — J069 Acute upper respiratory infection, unspecified: Secondary | ICD-10-CM | POA: Diagnosis not present

## 2016-08-29 DIAGNOSIS — R109 Unspecified abdominal pain: Secondary | ICD-10-CM | POA: Diagnosis not present

## 2016-09-01 NOTE — Progress Notes (Signed)
51 y.o. [id number] MarriedCaucasianF here for annual exam.  Doing well.  Being followed conservatively for complex breast cysts.  Having regular cycles, still.    Reports over the past few months, she's had some low back pain and some RLQ pain and some pelvic pressure.  Has chronic issues with irregular bowel movements so there is no change.  She does use prunes or prune juice if doesn't have BM for two days.  Has hemorrhoid issues and has rectal bleeding on occasion with BM.  Saw Ortho who just couldn't come up with any cause of the low back issues.  She was put on a low dose of prednisone.  She had complete resolution of pain.  Also, pt requests b 12 testing.  Has been told in the past to take extra b12 but doesn't want to take anything that isn't necessary.  Patient's last menstrual period was 08/22/2016.          Sexually active: Yes.    The current method of family planning is condoms most of the time.    Exercising: Yes.    walking Smoker:  no  Health Maintenance: Pap:  05/15/15 negative, HR HPV negative, 05/09/14 negative, HR HPV negative  History of abnormal Pap:  yes MMG:  04/14/16 BIRADS 3 probably benign- 04 recall for 03/18  Colonoscopy:  2014- repeat 7 years  BMD:   never TDaP: unsure will update today Pneumonia vaccine(s):  never Zostavax:   never Hep C testing: not indicated  Screening Labs: PCP, Hb today: PCP   reports that she has never smoked. She has never used smokeless tobacco. She reports that she does not drink alcohol or use drugs.  Past Medical History:  Diagnosis Date  . Abnormal Pap smear 1/09   AGUS pap  . GERD (gastroesophageal reflux disease)   . H/O: C-section   . IBS (irritable bowel syndrome)    ?    Past Surgical History:  Procedure Laterality Date  . CESAREAN SECTION    . CYSTOSCOPY  4/09   in office, secondary to microscopic hematuria  . DILATION AND CURETTAGE OF UTERUS    . NASAL SEPTUM SURGERY    . TONSILLECTOMY      Current Outpatient  Prescriptions  Medication Sig Dispense Refill  . Azelastine HCl (ASTELIN NA) Place into the nose daily.     . Cholecalciferol (VITAMIN D PO) Take 4,000 Units by mouth daily.     . fexofenadine (ALLEGRA) 180 MG tablet Take 180 mg by mouth daily.    . fluticasone (FLONASE) 50 MCG/ACT nasal spray Place 2 sprays into the nose daily.    . Magnesium 200 MG TABS Take 200 mg by mouth daily.    . Omeprazole (PRILOSEC PO) Take 1 tablet by mouth daily.     . Probiotic Product (SOLUBLE FIBER/PROBIOTICS PO) Take by mouth daily.    Marland Kitchen QVAR 80 MCG/ACT inhaler 1 puff daily.    Marland Kitchen PROAIR HFA 108 (90 Base) MCG/ACT inhaler      No current facility-administered medications for this visit.     Family History  Problem Relation Age of Onset  . Hypertension Mother   . Osteoporosis Mother   . Hypertension Father   . Heart disease Father   . Osteoporosis Sister   . Breast cancer Maternal Grandmother   . Rheum arthritis Sister     age 23  . Arthritis Sister     central spinal cord syndrome age 66  . Colon polyps Mother  ROS:  Pertinent items are noted in HPI.  Otherwise, a comprehensive ROS was negative.  Exam:   BP 110/66 (BP Location: Right Arm, Patient Position: Sitting, Cuff Size: Normal)   Pulse 74   Resp 14   Ht 5\' 5"  (1.651 m)   Wt 149 lb (67.6 kg)   LMP 08/22/2016   BMI 24.79 kg/m   Weight change: +12#   Height: 5\' 5"  (165.1 cm)  Ht Readings from Last 3 Encounters:  09/02/16 5\' 5"  (1.651 m)  05/15/15 5' 4.5" (1.638 m)  05/09/14 5' 4.75" (1.645 m)    General appearance: alert, cooperative and appears stated age Head: Normocephalic, without obvious abnormality, atraumatic Neck: no adenopathy, supple, symmetrical, trachea midline and thyroid normal to inspection and palpation Lungs: clear to auscultation bilaterally Breasts: normal appearance, no masses or tenderness Heart: regular rate and rhythm Abdomen: soft, non-tender; bowel sounds normal; no masses,  no  organomegaly Extremities: extremities normal, atraumatic, no cyanosis or edema Skin: Skin color, texture, turgor normal. No rashes or lesions Lymph nodes: Cervical, supraclavicular, and axillary nodes normal. No abnormal inguinal nodes palpated Neurologic: Grossly normal   Pelvic: External genitalia:  no lesions              Urethra:  normal appearing urethra with no masses, tenderness or lesions              Bartholins and Skenes: normal                 Vagina: normal appearing vagina with normal color and discharge, no lesions              Cervix: no lesions              Pap taken: Yes.   Bimanual Exam:  Uterus:  normal size, contour, position, consistency, mobility, non-tender              Adnexa: normal adnexa and no mass, fullness, tenderness               Rectovaginal: Confirms               Anus:  normal sphincter tone, no lesions  Chaperone was present for exam.  A:  Well Woman with normal exam Pelvic pressure and low back pain--has seen ortho  GERD, IBS   P:   Mammogram guidelines reviewed pap smear obtained Tdap updated today B12 obtained today Consider POP if GI evaluation is normal.  Pt will call if desires to start.  Also, d/w pt proceeding with PUS.  She wants to see GI first but knows to call if desires to proceed with additional testing or if GI evaluation is negative. return annually or prn

## 2016-09-02 ENCOUNTER — Ambulatory Visit (INDEPENDENT_AMBULATORY_CARE_PROVIDER_SITE_OTHER): Payer: BLUE CROSS/BLUE SHIELD | Admitting: Obstetrics & Gynecology

## 2016-09-02 ENCOUNTER — Encounter: Payer: Self-pay | Admitting: Obstetrics & Gynecology

## 2016-09-02 VITALS — BP 110/66 | HR 74 | Resp 14 | Ht 65.0 in | Wt 149.0 lb

## 2016-09-02 DIAGNOSIS — R5383 Other fatigue: Secondary | ICD-10-CM

## 2016-09-02 DIAGNOSIS — Z01419 Encounter for gynecological examination (general) (routine) without abnormal findings: Secondary | ICD-10-CM | POA: Diagnosis not present

## 2016-09-02 DIAGNOSIS — Z23 Encounter for immunization: Secondary | ICD-10-CM | POA: Diagnosis not present

## 2016-09-02 DIAGNOSIS — Z124 Encounter for screening for malignant neoplasm of cervix: Secondary | ICD-10-CM

## 2016-09-02 LAB — VITAMIN B12: VITAMIN B 12: 415 pg/mL (ref 200–1100)

## 2016-09-06 LAB — IPS PAP TEST WITH REFLEX TO HPV

## 2016-10-04 ENCOUNTER — Telehealth: Payer: Self-pay

## 2016-10-04 NOTE — Telephone Encounter (Signed)
Spoke with patient. Patient states that she is scheduled to see her GI doctor next week (unsure of actual date ). Would like to have this appointment for evaluation and see what is recommended. Patient will have notes from her GI appointment sent to [place] and will return call if any GYN testing is recommended.  Routing to provider for final review. Patient agreeable to disposition. Will close encounter.

## 2016-10-04 NOTE — Telephone Encounter (Signed)
-----   Message from Megan Salon, MD sent at 10/04/2016 12:24 PM EDT ----- Regarding: follow up from [place] 09/02/16 Can you please call pt.  She was having some pelvic pain issues that may have been ortho related.  I've tried to get GI records but have been unsuccessful in getting the last colonoscopy report.  I'm wondering how she is doing and if we need to proceed with any additional testing.  We had discussed the possibility of doing a PUS as well.  Just want an update.  Thanks.

## 2016-10-13 DIAGNOSIS — N6002 Solitary cyst of left breast: Secondary | ICD-10-CM | POA: Diagnosis not present

## 2016-10-24 ENCOUNTER — Telehealth: Payer: Self-pay | Admitting: *Deleted

## 2016-10-24 NOTE — Telephone Encounter (Signed)
Disregard last message.  

## 2016-10-24 NOTE — Telephone Encounter (Signed)
Patient had 6 month follow up of L Breast U/S performed on 10/18/16 at [facility]. Report has been faxed over. Patient scheduled next 6 month follow-up appointment for 04/13/17.

## 2016-10-24 NOTE — Telephone Encounter (Signed)
Patient in 04 recall for 09/2016 for breast U/S. Please contact patient regarding scheduling  Thanks

## 2016-10-24 NOTE — Telephone Encounter (Signed)
Patient had 6 month follow-up of L Breast U/S performed on 10/13/16 at [facility]. Report has been faxed over. Patient scheduled Screening Mammogram on 04/19/17 at [place].

## 2016-10-25 ENCOUNTER — Telehealth: Payer: Self-pay | Admitting: Obstetrics & Gynecology

## 2016-10-25 DIAGNOSIS — K921 Melena: Secondary | ICD-10-CM | POA: Diagnosis not present

## 2016-10-25 DIAGNOSIS — R102 Pelvic and perineal pain: Secondary | ICD-10-CM

## 2016-10-25 DIAGNOSIS — R1031 Right lower quadrant pain: Secondary | ICD-10-CM | POA: Diagnosis not present

## 2016-10-25 DIAGNOSIS — K602 Anal fissure, unspecified: Secondary | ICD-10-CM | POA: Diagnosis not present

## 2016-10-25 DIAGNOSIS — K648 Other hemorrhoids: Secondary | ICD-10-CM | POA: Diagnosis not present

## 2016-10-25 NOTE — Telephone Encounter (Signed)
Spoke with patient. Patient states she was seen by GI today and was advised she may proceed with PUS with Dr. Sabra Heck. Patient states she discussed this at last AEX dated 09/02/16. Patient scheduled for PUS on 10/27/16 at 11am with consult to follow at 11:30 with Dr. Sabra Heck. Advised patient would review with Dr. Sabra Heck and return call with any additional recommendations. Patient verbalizes understanding and is agreeable.   Routing to provider for final review. Patient is agreeable to disposition. Will close encounter.   Cc: Lerry Liner

## 2016-10-25 NOTE — Telephone Encounter (Signed)
Patient wants to schedule an ultrasound. She states that her other doctor is recommending she have this done.

## 2016-10-27 ENCOUNTER — Ambulatory Visit (INDEPENDENT_AMBULATORY_CARE_PROVIDER_SITE_OTHER): Payer: BLUE CROSS/BLUE SHIELD

## 2016-10-27 ENCOUNTER — Ambulatory Visit (INDEPENDENT_AMBULATORY_CARE_PROVIDER_SITE_OTHER): Payer: BLUE CROSS/BLUE SHIELD | Admitting: Obstetrics & Gynecology

## 2016-10-27 ENCOUNTER — Encounter: Payer: Self-pay | Admitting: Obstetrics & Gynecology

## 2016-10-27 VITALS — BP 110/72 | HR 68 | Resp 12 | Ht 65.0 in | Wt 147.0 lb

## 2016-10-27 DIAGNOSIS — D252 Subserosal leiomyoma of uterus: Secondary | ICD-10-CM

## 2016-10-27 DIAGNOSIS — G8929 Other chronic pain: Secondary | ICD-10-CM | POA: Diagnosis not present

## 2016-10-27 DIAGNOSIS — D251 Intramural leiomyoma of uterus: Secondary | ICD-10-CM

## 2016-10-27 DIAGNOSIS — R102 Pelvic and perineal pain: Secondary | ICD-10-CM | POA: Diagnosis not present

## 2016-10-27 DIAGNOSIS — K602 Anal fissure, unspecified: Secondary | ICD-10-CM

## 2016-10-27 DIAGNOSIS — R1031 Right lower quadrant pain: Secondary | ICD-10-CM | POA: Diagnosis not present

## 2016-10-27 MED ORDER — TRIAMCINOLONE ACETONIDE 0.5 % EX OINT: 1.0000 "application " | TOPICAL_OINTMENT | Freq: Two times a day (BID) | CUTANEOUS | 0 refills | Status: DC

## 2016-10-27 NOTE — Progress Notes (Signed)
GYNECOLOGY  VISIT   HPI: 51 y.o. G34P0011 Married Caucasian female here for PUS for additional evaluation of RLQ pain accompanied by low back pain and pelvic pressure that has been present for several months.  I saw her for her routine exam on 09/02/16 and we discussed further evaluation.  She does have chornic issues with irregular bowel movements and constipation.  She also has issues with rectal bleeding with BMs.  She decided at that time to see her GI, Dr. Shary Key, for additional evaluation.  She was diagnosed with a deep anal fissure.  She is up to date with her colonoscopy screening.  PUS was recommended as well by Dr. Shary Key.  As we had also discussed this at her exam in February, she called back to schedule it.   GYNECOLOGIC HISTORY: Patient's last menstrual period was 10/09/2016. Contraception: condoms, most of the time  Patient Active Problem List   Diagnosis Date Noted  . ANXIETY 09/06/2007  . ALLERGIC RHINITIS, SEASONAL 09/06/2007  . ASTHMA 09/06/2007  . CONSTIPATION, CHRONIC 09/06/2007  . ARTHRALGIA 09/06/2007  . HEARTBURN 09/06/2007  . EPIGASTRIC PAIN 09/06/2007  . IRRITABLE BOWEL SYNDROME 04/18/2007  . GERD 09/19/2004  . EXTERNAL HEMORRHOIDS 11/19/2002    Past Medical History:  Diagnosis Date  . Abnormal Pap smear 1/09   AGUS pap  . GERD (gastroesophageal reflux disease)   . H/O: C-section   . IBS (irritable bowel syndrome)    ?    Past Surgical History:  Procedure Laterality Date  . CESAREAN SECTION    . CYSTOSCOPY  4/09   in office, secondary to microscopic hematuria  . DILATION AND CURETTAGE OF UTERUS    . NASAL SEPTUM SURGERY    . TONSILLECTOMY      MEDS:  Reviewed in EPIC and UTD  ALLERGIES: Shellfish allergy and Sulfa antibiotics  Family History  Problem Relation Age of Onset  . Hypertension Mother   . Osteoporosis Mother   . Hypertension Father   . Heart disease Father   . Osteoporosis Sister   . Breast cancer Maternal Grandmother   . Rheum  arthritis Sister     age 48  . Arthritis Sister     central spinal cord syndrome age 81  . Colon polyps Mother     SH:  Married, non smoker  Review of Systems  Gastrointestinal: Positive for abdominal pain (RLQ), blood in stool and constipation. Negative for nausea and vomiting.  Genitourinary: Negative for dysuria, flank pain, frequency, hematuria and urgency.  Musculoskeletal: Positive for back pain (low pback R>L).  All other systems reviewed and are negative.   PHYSICAL EXAMINATION:    BP 110/72 (BP Location: Right Arm, Patient Position: Sitting, Cuff Size: Normal)   Pulse 68   Resp 12   Ht 5\' 5"  (1.651 m)   Wt 147 lb (66.7 kg)   LMP 10/09/2016   BMI 24.46 kg/m      PUS: Uterus:  10 x 7.5 x 5.0cm with 3.8 x 3.9cm intramural fibroid that is adjacent to the endometrium but not within the cavity.  Additional 0.9 x 0.7cm and 1.3 x 0.7cm fibroids noted Endometrium: 6.21mm Left ovary:  2.5 x 1.7 x 1.7cm, 3.7cm3 volume, 0.7MA folicle noted Right ovary:  2.6 x 2.1 x 2.1cm, 1.3cm collapsed corpus luteal cyst as well as 2.6JF and 3.5KT follicles noted  Findings reviewed with patient.  She did have an ultrasound years ago but this is not available today for comparison.  Is likely in paper chart  in off site storage.  Will request this from storage for comparison.  Pt and I discussed typical symptoms assocaited with fibroids.  Her pain is not classic for fibroids as her bleeding is very manageable.  However, is it possible.  She and I discussed treatment options which would include uterine artery embolization vs myomectomy vs hysterectomy. She is not interested in anything this "aggressive" at this time.  She understnads the benign nature of fibroids so surgical intervention would be performed for hopeful resolution of pain, if she were ever to decide to proceed.  All questions answered.  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  Unless there is some significant change, she want to follow conservatively at this point.  Would consider repeating PUS in 6-12 months depending on comparison images.  Will communicate this with patient once filed chart is obtained.    Pt also requests a noted be sent the Dr. Shary Key.     ~25 minutes spent with patient >50% of time was in face to face discussion of above.

## 2016-10-29 DIAGNOSIS — K602 Anal fissure, unspecified: Secondary | ICD-10-CM | POA: Insufficient documentation

## 2016-10-29 DIAGNOSIS — D252 Subserosal leiomyoma of uterus: Secondary | ICD-10-CM

## 2016-10-29 DIAGNOSIS — D251 Intramural leiomyoma of uterus: Secondary | ICD-10-CM | POA: Insufficient documentation

## 2016-11-15 ENCOUNTER — Telehealth: Payer: Self-pay | Admitting: Obstetrics & Gynecology

## 2016-11-15 NOTE — Telephone Encounter (Signed)
Patient checking on the status of the ultrasound comparison that Dr Sabra Heck Was going to do for her.

## 2016-11-15 NOTE — Telephone Encounter (Signed)
Called pt.  No answer.  Reviewed DPR before leaving a message.    Prior ultrasound done 12/25/2006 at [facility] (which is not longer open) showed a 2.4 x 1.7 x 2.2cm fibroid.  Ultrasound done recently here showed three fibroids--3.9cm, 1.3cm and 0.9cm.  Advised pt to call back with any questions.  Ok to close encounter.

## 2016-11-15 NOTE — Telephone Encounter (Signed)
Routing to Dr.Miller for review and advise. 

## 2016-11-17 ENCOUNTER — Telehealth: Payer: Self-pay | Admitting: Obstetrics & Gynecology

## 2016-11-17 NOTE — Telephone Encounter (Signed)
Patient needs to find out where exactly the fibroid is at and is that primary reason for her right sided pain. And could that be the reason for her lower rectal pain.  Also wants to ask Dr Sabra Heck about a sore knot under her left armpit and how long she should wait to get it looked at.

## 2016-11-17 NOTE — Telephone Encounter (Signed)
Spoke with patient. Patient states she missed a phone call from Dr. Sabra Heck on 5/1 to discuss fibroids and PUS results, would like to know if fibroids are causing pain and pressure in legs, pelvis and back? Patient states she also has a lump in left armpit that she noticed 2 days ago, reports as sore. Recommended OV to discuss results and evaluation of lump, patient declined OV for today, needs something around lunch tomorrow. Patient scheduled for 11:15am on 11/18/16 with Dr. Sabra Heck. Patient is agreeable to date and time.  Routing to provider for final review. Patient is agreeable to disposition. Will close encounter.

## 2016-11-18 ENCOUNTER — Telehealth: Payer: Self-pay | Admitting: Obstetrics & Gynecology

## 2016-11-18 ENCOUNTER — Ambulatory Visit: Payer: Self-pay | Admitting: Obstetrics & Gynecology

## 2016-11-18 NOTE — Telephone Encounter (Signed)
Ok to just reschedule the patient.  [facility] policy does not need to apply.  Thanks.

## 2016-11-18 NOTE — Telephone Encounter (Signed)
Patient canceled her appt for today. Patient states she spoke with [name] yesterday and her car is still in the shop. Rescheduled to 11/22/16. Will the [facility] policy still apply?

## 2016-11-21 NOTE — Telephone Encounter (Signed)
Patient called to cancel appointment for 11/22/16 for lump at armpit. Patient states she will call back to reschedule if lump reappears.   Routing to provider for final review.

## 2016-11-22 ENCOUNTER — Ambulatory Visit: Payer: Self-pay | Admitting: Obstetrics & Gynecology

## 2016-11-23 IMAGING — CT CT MAXILLOFACIAL W/O CM
3 of 4 series · 16 of 47 positions shown, 19 images · non-contrast
Comparison: Limited sinus CT 01/26/2012.

CLINICAL DATA: Chronic sinusitis.  Tooth pain

EXAM:
CT MAXILLOFACIAL WITHOUT CONTRAST
TECHNIQUE: Multidetector CT imaging of the maxillofacial structures was
performed. Multiplanar CT image reconstructions were also generated.
A small metallic BB was placed on the right temple in order to
reliably differentiate right from left.

[Series 2: ax bone · axial · 0.33mm/px · z∈[-41,+36]mm · 10 of 37 slices shown, 13 images]
[im 3/37  brain]
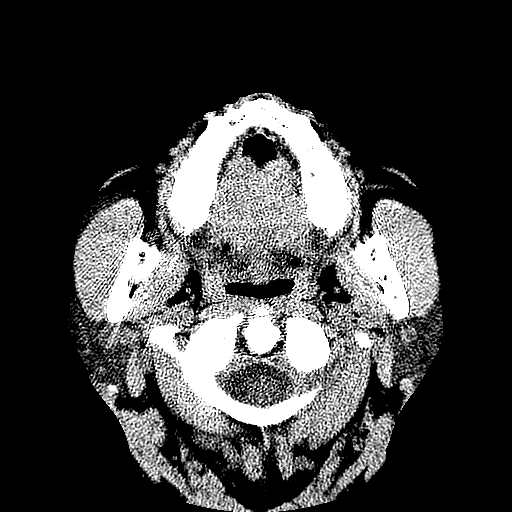
[im 3/37  bone]
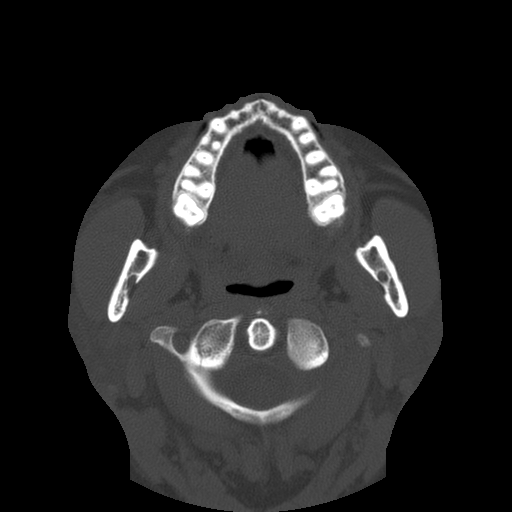
[im 6/37  bone]
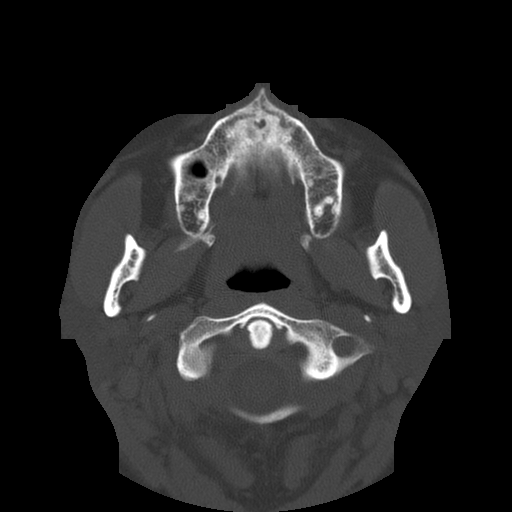
[im 11/37  bone]
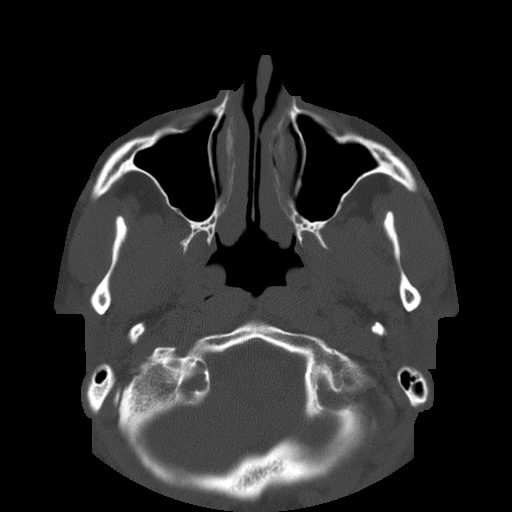
[im 13/37  bone]
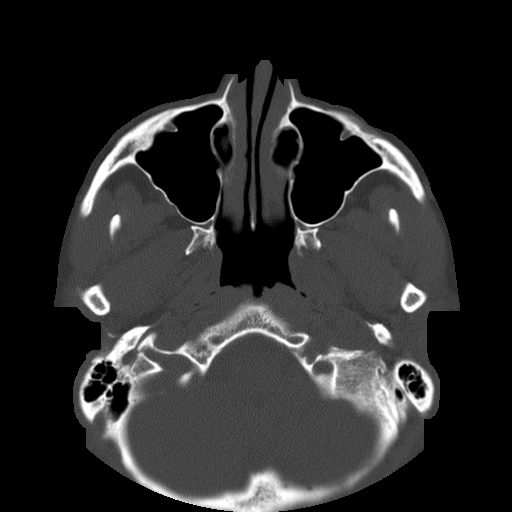
[im 16/37  brain]
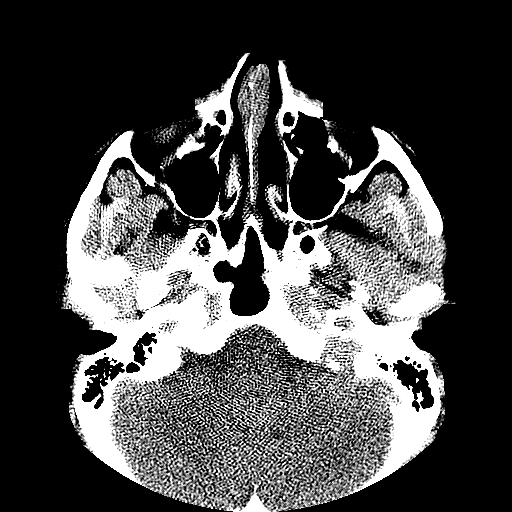
[im 16/37  bone]
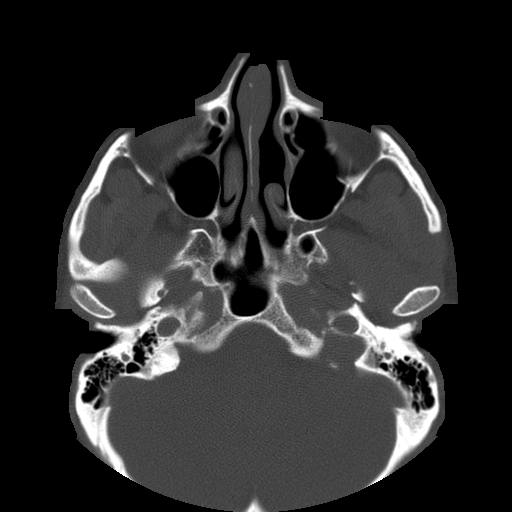
[im 21/37  bone]
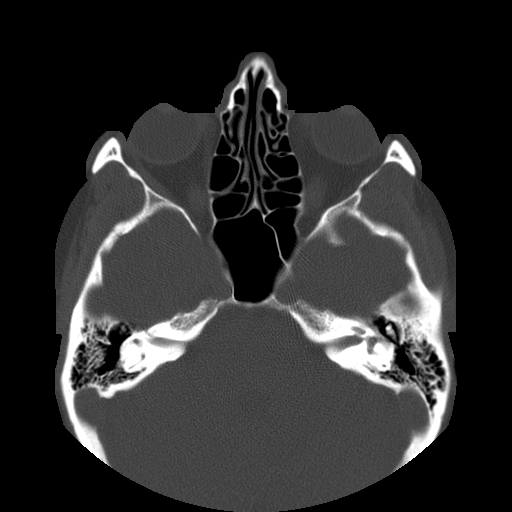
[im 24/37  bone]
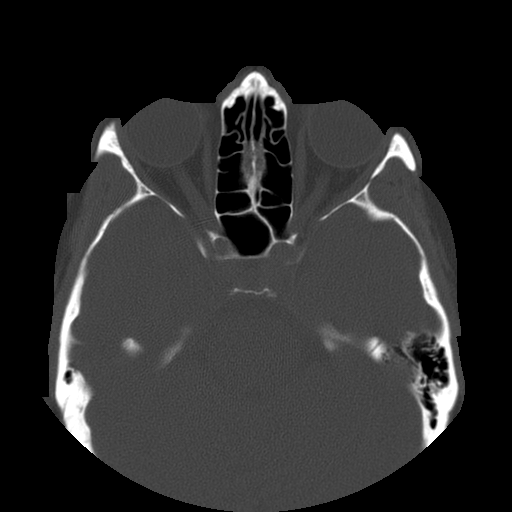
[im 26/37  bone]
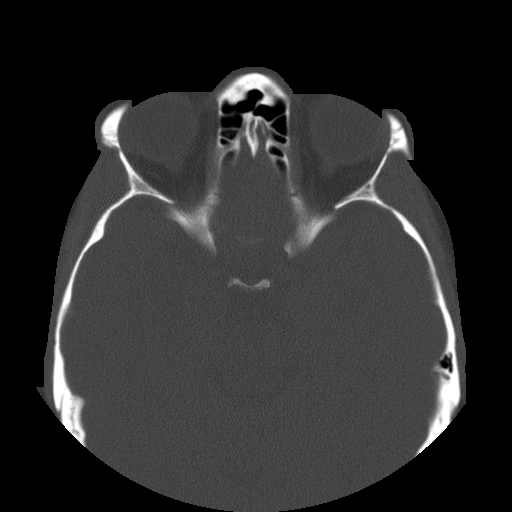
[im 31/37  brain]
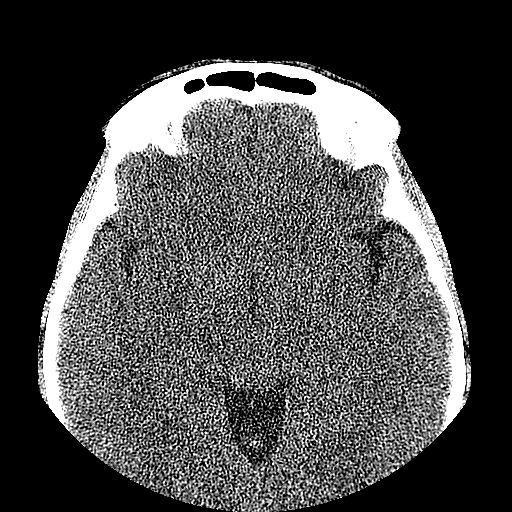
[im 31/37  bone]
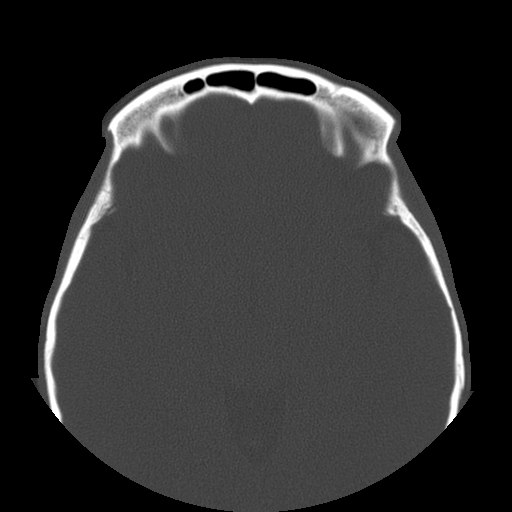
[im 34/37  bone]
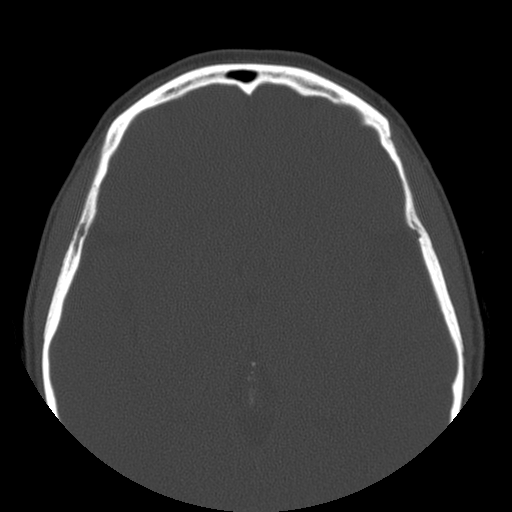

[Series 400: cor · coronal · 0.33mm/px · 3 of 79 slices shown]
[im 27/79  bone]
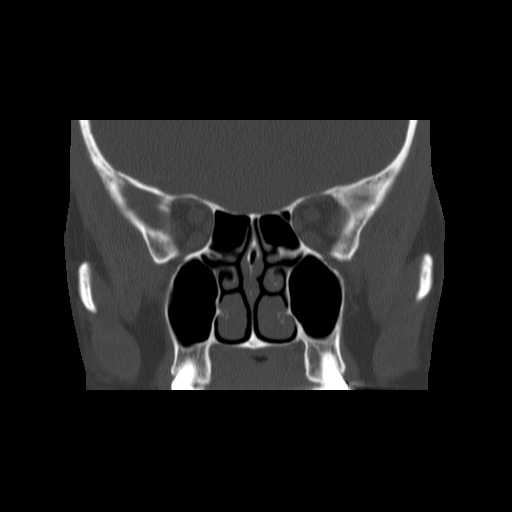
[im 35/79  bone]
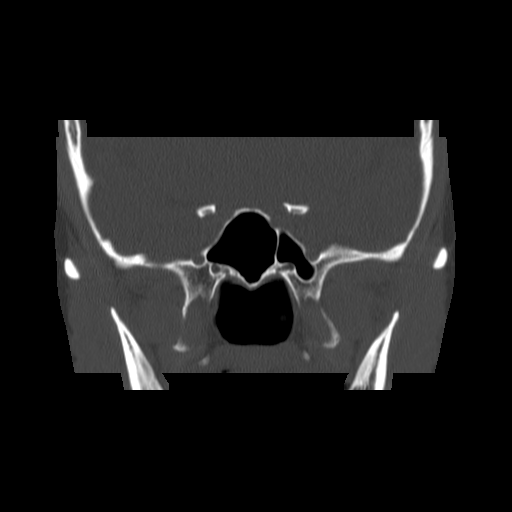
[im 44/79  bone]
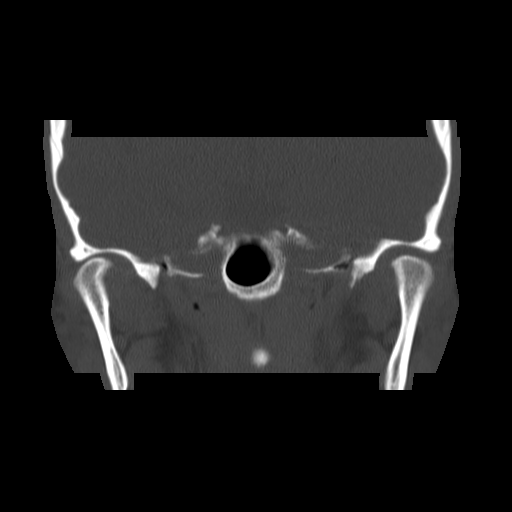

[Series 401: sag · sagittal · 0.33mm/px · 3 of 74 slices shown]
[im 25/74  bone]
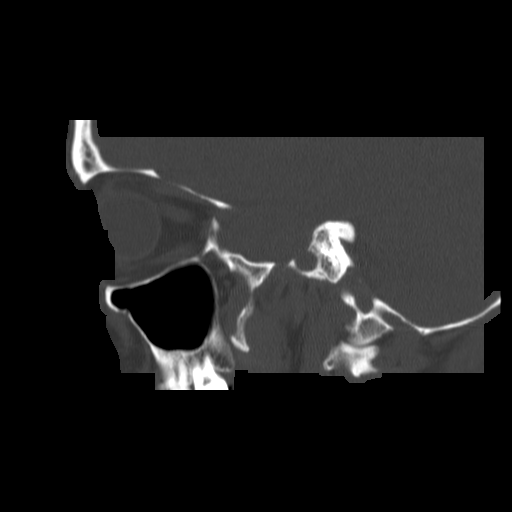
[im 37/74  bone]
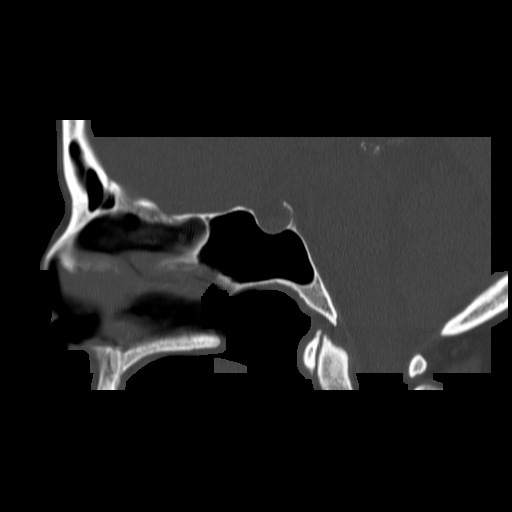
[im 49/74  bone]
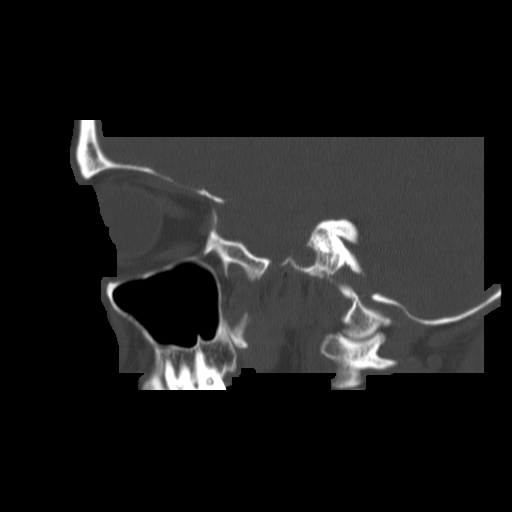

[16 of 47 positions shown; findings below may reference images not displayed]

FINDINGS: The paranasal sinuses are clear. The middle turbinate is absent
anteriorly, likely surgical. Nasal cavity is otherwise clear.
Limited imaging of the brain is unremarkable.
IMPRESSION: 1. Paranasal sinuses are clear without evidence for acute or chronic
sinusitis.
2. The left middle turbinate is absent anteriorly, likely
postsurgical. The nasal cavity is clear.

## 2016-12-08 DIAGNOSIS — J0100 Acute maxillary sinusitis, unspecified: Secondary | ICD-10-CM | POA: Diagnosis not present

## 2017-02-14 ENCOUNTER — Ambulatory Visit (INDEPENDENT_AMBULATORY_CARE_PROVIDER_SITE_OTHER): Payer: BLUE CROSS/BLUE SHIELD | Admitting: Podiatry

## 2017-02-14 ENCOUNTER — Encounter: Payer: Self-pay | Admitting: Podiatry

## 2017-02-14 DIAGNOSIS — M722 Plantar fascial fibromatosis: Secondary | ICD-10-CM | POA: Diagnosis not present

## 2017-02-14 DIAGNOSIS — L709 Acne, unspecified: Secondary | ICD-10-CM | POA: Diagnosis not present

## 2017-02-14 DIAGNOSIS — M79672 Pain in left foot: Secondary | ICD-10-CM

## 2017-02-14 DIAGNOSIS — Q828 Other specified congenital malformations of skin: Secondary | ICD-10-CM | POA: Diagnosis not present

## 2017-02-14 DIAGNOSIS — M79671 Pain in right foot: Secondary | ICD-10-CM | POA: Diagnosis not present

## 2017-02-14 DIAGNOSIS — D229 Melanocytic nevi, unspecified: Secondary | ICD-10-CM | POA: Diagnosis not present

## 2017-02-14 NOTE — Progress Notes (Signed)
   Subjective:    Patient ID: Laurie Flores, female    DOB: Jan 06, 1966, 51 y.o.   MRN: [id number]  HPI  Ms. Sindoni Presents the office they for concerns of hard spots in the bottom of both of her feet she points to submetatarsal 5. 7 ongoing for some time and they are painful she stands for long period time. Denies any redness or drainage he swelling. She had a recent treatment for this. Janett Billow she gets lateral pain for the first 5 minutes the bones her feet which she first gets up in the morning. She walks the pain does decrease. She is able to recall exactly where her she does states the entire bottom is uncomfortable. She said no recent treatment for this. This been ongoing for 4-5 months. Numbness or tingling. No swelling or redness. No other concerns.   Review of Systems  All other systems reviewed and are negative.      Objective:   Physical Exam General: AAO x3, NAD  Dermatological: Annular, punctate hyperkeratotic lesions present bilateral semesters 5. Upon debridement there is no underlying ulceration, drainage or any signs of infection present.  Vascular: Dorsalis Pedis artery and Posterior Tibial artery pedal pulses are 2/4 bilateral with immedate capillary fill time.  There is no pain with calf compression, swelling, warmth, erythema.   Neruologic: Grossly intact via light touch bilateral. Vibratory intact via tuning fork bilateral. Protective threshold with Semmes Wienstein monofilament intact to all pedal sites bilateral.   Musculoskeletal:  Cavus foot that is present. Taylor's bunions are present bilaterally. There is no discomfort along the course or insertion of plantar fascia Achilles tendon intact. There is no area pinpoint bony tenderness or pain the vibratory sensation. There is no overlying edema, erythema, increase in warmth bilaterally. Upon palpation along the medial band of plantar fascia the arch of the foot is were he subjective gets discomfort in the  mornings.Muscular strength 5/5 in all groups tested bilateral.  Gait: Unassisted, Nonantalgic.      Assessment & Plan:  51 year old female bilateral porokeratosis, AM foot pain likely plantar fasciitis -Treatment options discussed including all alternatives, risks, and complications -Etiology of symptoms were discussed -Sharply debrided hyperkeratotic lesions 2 without complications or bleeding. -Overall discussed stretching exercises for plantar fasciitis which is consistent with her symptoms in the morning. -Also recommended inserts. She is also an over-the-counter insert. She's Loel Ro get an insert for [employer]. If no improvement with this within consider custom insert. -Shoe gear change -RTC 2 months or sooner if needed.   Celesta Gentile, DPM

## 2017-02-14 NOTE — Patient Instructions (Addendum)

## 2017-02-24 ENCOUNTER — Ambulatory Visit (INDEPENDENT_AMBULATORY_CARE_PROVIDER_SITE_OTHER): Payer: BLUE CROSS/BLUE SHIELD | Admitting: Obstetrics & Gynecology

## 2017-02-24 ENCOUNTER — Encounter: Payer: Self-pay | Admitting: Obstetrics & Gynecology

## 2017-02-24 VITALS — BP 112/70 | HR 68 | Resp 16 | Wt 145.0 lb

## 2017-02-24 DIAGNOSIS — R1031 Right lower quadrant pain: Secondary | ICD-10-CM

## 2017-02-24 DIAGNOSIS — R102 Pelvic and perineal pain: Secondary | ICD-10-CM

## 2017-02-24 DIAGNOSIS — D252 Subserosal leiomyoma of uterus: Secondary | ICD-10-CM | POA: Diagnosis not present

## 2017-02-24 DIAGNOSIS — G8929 Other chronic pain: Secondary | ICD-10-CM | POA: Diagnosis not present

## 2017-02-24 DIAGNOSIS — D251 Intramural leiomyoma of uterus: Secondary | ICD-10-CM

## 2017-02-24 NOTE — Progress Notes (Signed)
GYNECOLOGY  VISIT   HPI: 52 y.o. G30P0011 Married Caucasian female here for discussion of possible treatment options for pelvic/abdominal pain, low back pain, thigh pain that pt feels may be due to fibroid uterus.  She had most recent ultrasound on 10/27/16 showign 10 x 7.5 x 5.0cm uterus with 3.8 x 3.9cm intramural fibroid with two smaller fibroids noted as well.  She has undergone a GI evaluation with colonoscopy that was negative.  Also had evaluation with ortho.  Prednione did help but the improvement quickly went away after finishing prednisone.  She is not sure if symptoms are due to fibroids but wants to review options and consider what she may be able to due to improve her symptoms.    Images reviewed and comparison ultrasounds from prior 10 years (there are two showing one small fibroid) reviewed.  Options for treatment of fibroids discussed including OCPs, POPs, other progesterone methods, endometrial ablation, myomectomy, uterine artery embolization and hysterectomy.  Possibility of presence of endometriosis reviewed as well.  Procedures, risks and benefits and recovery all discussed.  Informational brochure provided.  Pt has list of questions which were all addressed as well.    GYNECOLOGIC HISTORY: Patient's last menstrual period was 02/09/2017. Contraception: condoms  Patient Active Problem List   Diagnosis Date Noted  . Porokeratosis 02/14/2017  . Plantar fasciitis 02/14/2017  . Intramural and subserous leiomyoma of uterus 10/29/2016  . Anal fissure 10/29/2016  . ANXIETY 09/06/2007  . ALLERGIC RHINITIS, SEASONAL 09/06/2007  . ASTHMA 09/06/2007  . CONSTIPATION, CHRONIC 09/06/2007  . ARTHRALGIA 09/06/2007  . HEARTBURN 09/06/2007  . EPIGASTRIC PAIN 09/06/2007  . IRRITABLE BOWEL SYNDROME 04/18/2007  . GERD 09/19/2004  . EXTERNAL HEMORRHOIDS 11/19/2002    Past Medical History:  Diagnosis Date  . Abnormal Pap smear 1/09   AGUS pap  . GERD (gastroesophageal reflux disease)   .  H/O: C-section   . IBS (irritable bowel syndrome)    ?    Past Surgical History:  Procedure Laterality Date  . CESAREAN SECTION    . CYSTOSCOPY  4/09   in office, secondary to microscopic hematuria  . DILATION AND CURETTAGE OF UTERUS    . NASAL SEPTUM SURGERY    . TONSILLECTOMY      MEDS:   Current Outpatient Prescriptions on File Prior to Visit  Medication Sig Dispense Refill  . Azelastine HCl (ASTELIN NA) Place into the nose daily.     . Cholecalciferol (VITAMIN D PO) Take 4,000 Units by mouth daily.     . fexofenadine (ALLEGRA) 180 MG tablet Take 180 mg by mouth daily.    . fluticasone (FLONASE) 50 MCG/ACT nasal spray Place 2 sprays into the nose daily.    . Magnesium 200 MG TABS Take 200 mg by mouth daily.    . Omeprazole (PRILOSEC PO) Take 1 tablet by mouth daily.     Marland Kitchen PROAIR HFA 108 (90 Base) MCG/ACT inhaler     . Probiotic Product (SOLUBLE FIBER/PROBIOTICS PO) Take by mouth daily.    Marland Kitchen QVAR 80 MCG/ACT inhaler 1 puff daily.    Marland Kitchen triamcinolone ointment (KENALOG) 0.5 % Apply 1 application topically 2 (two) times daily. (Patient not taking: Reported on 02/24/2017) 15 g 0   No current facility-administered medications on file prior to visit.      ALLERGIES: Shellfish allergy and Sulfa antibiotics  Family History  Problem Relation Age of Onset  . Hypertension Mother   . Osteoporosis Mother   . Colon polyps Mother   .  Hypertension Father   . Heart disease Father   . Osteoporosis Sister   . Breast cancer Maternal Grandmother   . Rheum arthritis Sister        age 62  . Arthritis Sister        central spinal cord syndrome age 31    SH:  Married, non smoker  Review of Systems  Gastrointestinal: Positive for abdominal pain (espeically on right).  All other systems reviewed and are negative.   PHYSICAL EXAMINATION:    BP 112/70 (BP Location: Right Arm, Patient Position: Sitting, Cuff Size: Normal)   Pulse 68   Resp 16   Wt 145 lb (65.8 kg)   LMP 02/09/2017    BMI 24.13 kg/m     Physical Exam  Constitutional: She is oriented to person, place, and time. She appears well-developed and well-nourished.  Neurological: She is alert and oriented to person, place, and time.  Psychiatric: She has a normal mood and affect.  no other physical exam performed today  Assessment: Uterine fibroids RLQ pain, chronic H/O anal fissure that has improved  Plan: Treatment options for fibroids all reviewed.  Would really have pt consider [place] vs hysterectomy.  If [place] helped her symptoms then we could be more sure that hysterectomy would resolve problems.  Pt is aware that there is the possibility if she did proceed with surgery that pain may not fully resolve or even possibly not change.  She wants to consider options.   ~30 minutes spent with patient >50% of time was in face to face discussion of above.

## 2017-03-10 DIAGNOSIS — Z13220 Encounter for screening for lipoid disorders: Secondary | ICD-10-CM | POA: Diagnosis not present

## 2017-03-10 DIAGNOSIS — E559 Vitamin D deficiency, unspecified: Secondary | ICD-10-CM | POA: Diagnosis not present

## 2017-03-10 DIAGNOSIS — J4530 Mild persistent asthma, uncomplicated: Secondary | ICD-10-CM | POA: Diagnosis not present

## 2017-03-10 DIAGNOSIS — D259 Leiomyoma of uterus, unspecified: Secondary | ICD-10-CM | POA: Diagnosis not present

## 2017-03-10 DIAGNOSIS — Z0000 Encounter for general adult medical examination without abnormal findings: Secondary | ICD-10-CM | POA: Diagnosis not present

## 2017-03-10 DIAGNOSIS — J302 Other seasonal allergic rhinitis: Secondary | ICD-10-CM | POA: Diagnosis not present

## 2017-03-29 DIAGNOSIS — K648 Other hemorrhoids: Secondary | ICD-10-CM | POA: Diagnosis not present

## 2017-03-29 DIAGNOSIS — K602 Anal fissure, unspecified: Secondary | ICD-10-CM | POA: Diagnosis not present

## 2017-04-19 DIAGNOSIS — Z1231 Encounter for screening mammogram for malignant neoplasm of breast: Secondary | ICD-10-CM | POA: Diagnosis not present

## 2017-04-25 DIAGNOSIS — M25551 Pain in right hip: Secondary | ICD-10-CM | POA: Diagnosis not present

## 2017-05-23 DIAGNOSIS — J209 Acute bronchitis, unspecified: Secondary | ICD-10-CM | POA: Diagnosis not present

## 2017-05-23 DIAGNOSIS — J4530 Mild persistent asthma, uncomplicated: Secondary | ICD-10-CM | POA: Diagnosis not present

## 2017-05-23 DIAGNOSIS — J029 Acute pharyngitis, unspecified: Secondary | ICD-10-CM | POA: Diagnosis not present

## 2017-05-23 DIAGNOSIS — R509 Fever, unspecified: Secondary | ICD-10-CM | POA: Diagnosis not present

## 2017-05-27 DIAGNOSIS — Z8701 Personal history of pneumonia (recurrent): Secondary | ICD-10-CM | POA: Diagnosis not present

## 2017-05-27 DIAGNOSIS — J019 Acute sinusitis, unspecified: Secondary | ICD-10-CM | POA: Diagnosis not present

## 2017-05-27 DIAGNOSIS — R05 Cough: Secondary | ICD-10-CM | POA: Diagnosis not present

## 2017-05-27 DIAGNOSIS — J209 Acute bronchitis, unspecified: Secondary | ICD-10-CM | POA: Diagnosis not present

## 2017-06-05 DIAGNOSIS — H5340 Unspecified visual field defects: Secondary | ICD-10-CM | POA: Diagnosis not present

## 2017-06-20 ENCOUNTER — Telehealth: Payer: Self-pay | Admitting: *Deleted

## 2017-06-20 NOTE — Telephone Encounter (Deleted)
Agentt faxing report.

## 2017-06-20 NOTE — Telephone Encounter (Signed)
Patient in 04 recall for 04/2017. Patient needs 6 month F/U left breast. Please contact patient regarding scheduling

## 2017-06-20 NOTE — Telephone Encounter (Addendum)
[employer] Spoke with Crystal. States patient had Screening [facility] 04/19/17. She is faxing report.

## 2017-07-07 ENCOUNTER — Encounter: Payer: Self-pay | Admitting: Obstetrics & Gynecology

## 2017-10-16 DIAGNOSIS — E559 Vitamin D deficiency, unspecified: Secondary | ICD-10-CM | POA: Insufficient documentation

## 2017-12-05 NOTE — Progress Notes (Signed)
52 y.o. [id number] MarriedCaucasianF here for annual exam.  Cycles are changing.  Having lots of bleeding and clots.  Having a fair amount of cramping with cycles but not every one.  Cycles are between 28-50 days.  Has known fibroids.     Has noticed some vaginal dryness over the past few months.  PCP:  Dr. Elenor Quinones  Patient's last menstrual period was 12/02/2017 (exact date).          Sexually active: Yes.    The current method of family planning is condoms everytime   Exercising: Yes.    walking and strength. Smoker:  no  Health Maintenance: Pap: 09-02-16 Neg, 05-15-15 Neg:Neg HR HPV History of abnormal Pap:  yes MMG:  04-19-17 Density B/Neg/BiRads1 Colonoscopy:  2014 - repeat 7 years BMD:   n/a  TDaP: 09-02-16 Pneumonia vaccine(s):  n/a Shingrix:   n/a Hep C testing: no Screening Labs: PCP   reports that she has never smoked. She has never used smokeless tobacco. She reports that she does not drink alcohol or use drugs.  Past Medical History:  Diagnosis Date  . Abnormal Pap smear 1/09   AGUS pap  . GERD (gastroesophageal reflux disease)   . H/O: C-section   . IBS (irritable bowel syndrome)    ?    Past Surgical History:  Procedure Laterality Date  . CESAREAN SECTION    . CYSTOSCOPY  4/09   in office, secondary to microscopic hematuria  . DILATION AND CURETTAGE OF UTERUS    . NASAL SEPTUM SURGERY    . TONSILLECTOMY      Current Outpatient Medications  Medication Sig Dispense Refill  . Azelastine HCl (ASTELIN NA) Place into the nose daily.     . Cholecalciferol (VITAMIN D PO) Take 2,000 Units by mouth daily.     . fexofenadine (ALLEGRA) 60 MG tablet Take 60 mg by mouth daily.    . fluticasone (FLONASE) 50 MCG/ACT nasal spray Place 2 sprays into the nose daily.    . Magnesium 200 MG TABS Take 200 mg by mouth daily.    Marland Kitchen PROAIR HFA 108 (90 Base) MCG/ACT inhaler     . Probiotic Product (SOLUBLE FIBER/PROBIOTICS PO) Take by mouth daily.    Marland Kitchen QVAR 80 MCG/ACT  inhaler 1 puff daily.     No current facility-administered medications for this visit.     Family History  Problem Relation Age of Onset  . Hypertension Mother   . Osteoporosis Mother   . Colon polyps Mother   . Hypertension Father   . Heart disease Father   . Osteoporosis Sister   . Breast cancer Maternal Grandmother   . Rheum arthritis Sister        age 56  . Arthritis Sister        central spinal cord syndrome age 63    Review of Systems  Constitutional: Negative.   HENT: Negative.   Eyes: Negative.   Respiratory: Negative.   Cardiovascular: Negative.   Gastrointestinal: Negative.   Genitourinary: Negative.   Musculoskeletal: Negative.   Skin: Negative.   Neurological: Negative.   Endo/Heme/Allergies: Negative.   Psychiatric/Behavioral: Negative.     Exam:   BP 122/66 (BP Location: Right Arm, Patient Position: Sitting, Cuff Size: Normal)   Pulse 60   Resp 16   Ht 5\' 5"  (1.651 m)   Wt 147 lb (66.7 kg)   LMP 12/02/2017 (Exact Date)   BMI 24.46 kg/m     Height: 5\' 5"  (165.1 cm)  Ht Readings from Last 3 Encounters:  12/08/17 5\' 5"  (1.651 m)  10/27/16 5\' 5"  (1.651 m)  09/02/16 5\' 5"  (1.651 m)    General appearance: alert, cooperative and appears stated age Head: Normocephalic, without obvious abnormality, atraumatic Neck: no adenopathy, supple, symmetrical, trachea midline and thyroid normal to inspection and palpation Lungs: clear to auscultation bilaterally Breasts: normal appearance, no masses or tenderness Heart: regular rate and rhythm Abdomen: soft, non-tender; bowel sounds normal; no masses,  no organomegaly Extremities: extremities normal, atraumatic, no cyanosis or edema Skin: Skin color, texture, turgor normal. No rashes or lesions Lymph nodes: Cervical, supraclavicular, and axillary nodes normal. No abnormal inguinal nodes palpated Neurologic: Grossly normal   Pelvic: External genitalia:  no lesions              Urethra:  normal appearing  urethra with no masses, tenderness or lesions              Bartholins and Skenes: normal                 Vagina: normal appearing vagina with normal color and discharge, no lesions              Cervix: no lesions              Pap taken: No. Bimanual Exam:  Uterus:  About 10 week sized uterus, fullness off to right              Adnexa: normal adnexa and no mass, fullness, tenderness               Rectovaginal: Confirms               Anus:  normal sphincter tone, no lesions  Chaperone was present for exam.  A:  Well Woman with normal exam Fibroid with pelvic pressure, low back pain H/O GERD and IBS Fatigue Vaginal dryness  P:   Mammogram guidelines reviewed pap smear neg 2018 and neg HR HPV 2016 CBC, iron and TIBC, Ferritin, TSH will be obtained today FSH will also be obtained Vit D obtained today Return annually or prn

## 2017-12-08 ENCOUNTER — Encounter: Payer: Self-pay | Admitting: Obstetrics & Gynecology

## 2017-12-08 ENCOUNTER — Ambulatory Visit (INDEPENDENT_AMBULATORY_CARE_PROVIDER_SITE_OTHER): Payer: 59 | Admitting: Obstetrics & Gynecology

## 2017-12-08 VITALS — BP 122/66 | HR 60 | Resp 16 | Ht 65.0 in | Wt 147.0 lb

## 2017-12-08 DIAGNOSIS — N921 Excessive and frequent menstruation with irregular cycle: Secondary | ICD-10-CM

## 2017-12-08 DIAGNOSIS — R5383 Other fatigue: Secondary | ICD-10-CM

## 2017-12-08 DIAGNOSIS — N898 Other specified noninflammatory disorders of vagina: Secondary | ICD-10-CM | POA: Diagnosis not present

## 2017-12-08 DIAGNOSIS — Z0000 Encounter for general adult medical examination without abnormal findings: Secondary | ICD-10-CM | POA: Diagnosis not present

## 2017-12-08 DIAGNOSIS — Z01419 Encounter for gynecological examination (general) (routine) without abnormal findings: Secondary | ICD-10-CM | POA: Diagnosis not present

## 2017-12-09 LAB — IRON AND TIBC
IRON SATURATION: 17 % (ref 15–55)
Iron: 59 ug/dL (ref 27–159)
Total Iron Binding Capacity: 338 ug/dL (ref 250–450)
UIBC: 279 ug/dL (ref 131–425)

## 2017-12-09 LAB — CBC
Hematocrit: 38.3 % (ref 34.0–46.6)
Hemoglobin: 12.5 g/dL (ref 11.1–15.9)
MCH: 28.8 pg (ref 26.6–33.0)
MCHC: 32.6 g/dL (ref 31.5–35.7)
MCV: 88 fL (ref 79–97)
PLATELETS: 317 10*3/uL (ref 150–450)
RBC: 4.34 x10E6/uL (ref 3.77–5.28)
RDW: 14.3 % (ref 12.3–15.4)
WBC: 7.9 10*3/uL (ref 3.4–10.8)

## 2017-12-09 LAB — FERRITIN: Ferritin: 12 ng/mL — ABNORMAL LOW (ref 15–150)

## 2017-12-09 LAB — VITAMIN D 25 HYDROXY (VIT D DEFICIENCY, FRACTURES): Vit D, 25-Hydroxy: 32.4 ng/mL (ref 30.0–100.0)

## 2017-12-09 LAB — TSH: TSH: 2.6 u[IU]/mL (ref 0.450–4.500)

## 2017-12-09 LAB — FOLLICLE STIMULATING HORMONE: FSH: 6.9 m[IU]/mL

## 2017-12-12 ENCOUNTER — Telehealth: Payer: Self-pay | Admitting: *Deleted

## 2017-12-12 NOTE — Telephone Encounter (Signed)
Pt notified.  Verbalized understanding.

## 2017-12-12 NOTE — Telephone Encounter (Signed)
-----   Message from Megan Salon, MD sent at 12/11/2017 11:25 PM EDT ----- Please let pt knwo her CBC was normal and hb was good at 12.5.  Ferritin is low at 12.  Iron levels are ok.  Thyroid was normal.  FSH was 6.9.  This does not appear that she is close to menopause.    It think she should start daily iron like slow FE to help with the iron store value (ferritin).  Would repeat this next year.  Thanks.

## 2017-12-12 NOTE — Telephone Encounter (Signed)
LM for pt to call back.

## 2017-12-14 ENCOUNTER — Telehealth: Payer: Self-pay | Admitting: Obstetrics & Gynecology

## 2017-12-14 NOTE — Telephone Encounter (Signed)
Spoke with patient. Reviewed results with patient as seen below. Patient verbalizes understanding.  Notes recorded by Megan Salon, MD on 12/11/2017 at 11:25 PM EDT Please let pt knwo her CBC was normal and hb was good at 12.5. Ferritin is low at 12. Iron levels are ok. Thyroid was normal. FSH was 6.9. This does not appear that she is close to menopause.   It think she should start daily iron like slow FE to help with the iron store value (ferritin). Would repeat this next year. Thanks.  Routing to provider for final review. Patient agreeable to disposition. Will close encounter.

## 2017-12-14 NOTE — Telephone Encounter (Signed)
Patient was given test results and calling back to get the numbers.

## 2018-02-08 DIAGNOSIS — M216X1 Other acquired deformities of right foot: Secondary | ICD-10-CM | POA: Diagnosis not present

## 2018-02-08 DIAGNOSIS — M79672 Pain in left foot: Secondary | ICD-10-CM | POA: Diagnosis not present

## 2018-02-08 DIAGNOSIS — M79671 Pain in right foot: Secondary | ICD-10-CM | POA: Diagnosis not present

## 2018-02-08 DIAGNOSIS — M216X2 Other acquired deformities of left foot: Secondary | ICD-10-CM | POA: Diagnosis not present

## 2018-03-09 DIAGNOSIS — G44201 Tension-type headache, unspecified, intractable: Secondary | ICD-10-CM | POA: Diagnosis not present

## 2018-03-09 DIAGNOSIS — M9903 Segmental and somatic dysfunction of lumbar region: Secondary | ICD-10-CM | POA: Diagnosis not present

## 2018-03-09 DIAGNOSIS — M9901 Segmental and somatic dysfunction of cervical region: Secondary | ICD-10-CM | POA: Diagnosis not present

## 2018-03-09 DIAGNOSIS — M545 Low back pain: Secondary | ICD-10-CM | POA: Diagnosis not present

## 2018-03-19 DIAGNOSIS — M7522 Bicipital tendinitis, left shoulder: Secondary | ICD-10-CM | POA: Diagnosis not present

## 2018-03-19 DIAGNOSIS — M9901 Segmental and somatic dysfunction of cervical region: Secondary | ICD-10-CM | POA: Diagnosis not present

## 2018-03-19 DIAGNOSIS — M9902 Segmental and somatic dysfunction of thoracic region: Secondary | ICD-10-CM | POA: Diagnosis not present

## 2018-03-19 DIAGNOSIS — M546 Pain in thoracic spine: Secondary | ICD-10-CM | POA: Diagnosis not present

## 2018-03-19 DIAGNOSIS — M9903 Segmental and somatic dysfunction of lumbar region: Secondary | ICD-10-CM | POA: Diagnosis not present

## 2018-03-19 DIAGNOSIS — M545 Low back pain: Secondary | ICD-10-CM | POA: Diagnosis not present

## 2018-03-19 DIAGNOSIS — G44201 Tension-type headache, unspecified, intractable: Secondary | ICD-10-CM | POA: Diagnosis not present

## 2018-03-21 DIAGNOSIS — M9901 Segmental and somatic dysfunction of cervical region: Secondary | ICD-10-CM | POA: Diagnosis not present

## 2018-03-21 DIAGNOSIS — G44201 Tension-type headache, unspecified, intractable: Secondary | ICD-10-CM | POA: Diagnosis not present

## 2018-03-21 DIAGNOSIS — M9903 Segmental and somatic dysfunction of lumbar region: Secondary | ICD-10-CM | POA: Diagnosis not present

## 2018-03-21 DIAGNOSIS — M545 Low back pain: Secondary | ICD-10-CM | POA: Diagnosis not present

## 2018-03-27 DIAGNOSIS — G44201 Tension-type headache, unspecified, intractable: Secondary | ICD-10-CM | POA: Diagnosis not present

## 2018-03-27 DIAGNOSIS — M9903 Segmental and somatic dysfunction of lumbar region: Secondary | ICD-10-CM | POA: Diagnosis not present

## 2018-03-27 DIAGNOSIS — M545 Low back pain: Secondary | ICD-10-CM | POA: Diagnosis not present

## 2018-03-27 DIAGNOSIS — M9901 Segmental and somatic dysfunction of cervical region: Secondary | ICD-10-CM | POA: Diagnosis not present

## 2018-04-09 DIAGNOSIS — Z0000 Encounter for general adult medical examination without abnormal findings: Secondary | ICD-10-CM | POA: Diagnosis not present

## 2018-04-09 DIAGNOSIS — Z23 Encounter for immunization: Secondary | ICD-10-CM | POA: Diagnosis not present

## 2018-04-09 DIAGNOSIS — Z1231 Encounter for screening mammogram for malignant neoplasm of breast: Secondary | ICD-10-CM | POA: Diagnosis not present

## 2018-04-09 DIAGNOSIS — D229 Melanocytic nevi, unspecified: Secondary | ICD-10-CM | POA: Diagnosis not present

## 2018-05-13 DIAGNOSIS — J069 Acute upper respiratory infection, unspecified: Secondary | ICD-10-CM | POA: Diagnosis not present

## 2018-05-24 DIAGNOSIS — J069 Acute upper respiratory infection, unspecified: Secondary | ICD-10-CM | POA: Diagnosis not present

## 2018-06-07 DIAGNOSIS — Z1231 Encounter for screening mammogram for malignant neoplasm of breast: Secondary | ICD-10-CM | POA: Diagnosis not present

## 2018-06-11 DIAGNOSIS — H40053 Ocular hypertension, bilateral: Secondary | ICD-10-CM | POA: Diagnosis not present

## 2018-06-20 DIAGNOSIS — H5213 Myopia, bilateral: Secondary | ICD-10-CM | POA: Diagnosis not present

## 2018-06-28 DIAGNOSIS — J9801 Acute bronchospasm: Secondary | ICD-10-CM | POA: Diagnosis not present

## 2018-06-28 DIAGNOSIS — J069 Acute upper respiratory infection, unspecified: Secondary | ICD-10-CM | POA: Diagnosis not present

## 2018-07-12 DIAGNOSIS — J9801 Acute bronchospasm: Secondary | ICD-10-CM | POA: Diagnosis not present

## 2018-08-22 DIAGNOSIS — D229 Melanocytic nevi, unspecified: Secondary | ICD-10-CM | POA: Diagnosis not present

## 2018-08-22 DIAGNOSIS — L309 Dermatitis, unspecified: Secondary | ICD-10-CM | POA: Diagnosis not present

## 2018-09-14 DIAGNOSIS — J019 Acute sinusitis, unspecified: Secondary | ICD-10-CM | POA: Diagnosis not present

## 2018-11-07 DIAGNOSIS — J4540 Moderate persistent asthma, uncomplicated: Secondary | ICD-10-CM | POA: Diagnosis not present

## 2018-11-07 DIAGNOSIS — K219 Gastro-esophageal reflux disease without esophagitis: Secondary | ICD-10-CM | POA: Diagnosis not present

## 2018-11-07 DIAGNOSIS — J309 Allergic rhinitis, unspecified: Secondary | ICD-10-CM | POA: Diagnosis not present

## 2018-11-07 DIAGNOSIS — J3489 Other specified disorders of nose and nasal sinuses: Secondary | ICD-10-CM | POA: Diagnosis not present

## 2019-01-03 DIAGNOSIS — R079 Chest pain, unspecified: Secondary | ICD-10-CM | POA: Diagnosis not present

## 2019-01-03 DIAGNOSIS — E559 Vitamin D deficiency, unspecified: Secondary | ICD-10-CM | POA: Diagnosis not present

## 2019-01-03 DIAGNOSIS — K219 Gastro-esophageal reflux disease without esophagitis: Secondary | ICD-10-CM | POA: Diagnosis not present

## 2019-01-03 DIAGNOSIS — J4540 Moderate persistent asthma, uncomplicated: Secondary | ICD-10-CM | POA: Diagnosis not present

## 2019-01-07 ENCOUNTER — Telehealth: Payer: Self-pay | Admitting: Obstetrics & Gynecology

## 2019-01-07 DIAGNOSIS — G8929 Other chronic pain: Secondary | ICD-10-CM

## 2019-01-07 DIAGNOSIS — Z86018 Personal history of other benign neoplasm: Secondary | ICD-10-CM

## 2019-01-07 NOTE — Telephone Encounter (Signed)
Patient is having pain with cyst.

## 2019-01-08 ENCOUNTER — Other Ambulatory Visit: Payer: Self-pay

## 2019-01-08 NOTE — Telephone Encounter (Signed)
Spoke with patient. Patient reports intermittent RLQ abd/pelvic pain that occurs 2-3 wks out of the month, has increased the past 3 mo. Describes as sharp.  Denies pain today. Pain does not occur while on menses. LMP 12/25/18. Denies N/V, fever/chills, vag d/c, odor, flank pain, or urinary symptoms. Reports normal BMs. Patient is requesting to repeat PUS, last PUS 10/27/16, uterine fibroids.   PUS scheduled for 01/10/19 at 3pm, consult at 3:30pm with Dr. Sabra Heck. Patient has a AEX scheduled for 01/15/19.  Advised will review with Dr. Sabra Heck and return call if any additional recommendations. Order placed for precert. Patient verbalizes understanding.   Dr. Sabra Heck -ok to proceed with PUS as scheduled?   Cc: Lerry Liner, Magdalene Patricia

## 2019-01-10 ENCOUNTER — Encounter: Payer: Self-pay | Admitting: Obstetrics & Gynecology

## 2019-01-10 ENCOUNTER — Ambulatory Visit: Payer: BC Managed Care – PPO | Admitting: Obstetrics & Gynecology

## 2019-01-10 ENCOUNTER — Other Ambulatory Visit (HOSPITAL_COMMUNITY)
Admission: RE | Admit: 2019-01-10 | Discharge: 2019-01-10 | Disposition: A | Discharge status: Home / Self Care | Payer: BC Managed Care – PPO | Source: Ambulatory Visit | Attending: Obstetrics & Gynecology | Admitting: Obstetrics & Gynecology

## 2019-01-10 ENCOUNTER — Ambulatory Visit (INDEPENDENT_AMBULATORY_CARE_PROVIDER_SITE_OTHER): Payer: BC Managed Care – PPO

## 2019-01-10 ENCOUNTER — Other Ambulatory Visit: Payer: Self-pay

## 2019-01-10 VITALS — BP 128/70 | HR 68 | Temp 98.0°F | Ht 65.0 in | Wt 145.0 lb

## 2019-01-10 DIAGNOSIS — D251 Intramural leiomyoma of uterus: Secondary | ICD-10-CM

## 2019-01-10 DIAGNOSIS — Z86018 Personal history of other benign neoplasm: Secondary | ICD-10-CM

## 2019-01-10 DIAGNOSIS — N921 Excessive and frequent menstruation with irregular cycle: Secondary | ICD-10-CM

## 2019-01-10 DIAGNOSIS — Z01411 Encounter for gynecological examination (general) (routine) with abnormal findings: Secondary | ICD-10-CM

## 2019-01-10 DIAGNOSIS — R1031 Right lower quadrant pain: Secondary | ICD-10-CM | POA: Diagnosis not present

## 2019-01-10 DIAGNOSIS — G8929 Other chronic pain: Secondary | ICD-10-CM

## 2019-01-10 DIAGNOSIS — Z124 Encounter for screening for malignant neoplasm of cervix: Secondary | ICD-10-CM

## 2019-01-10 NOTE — Telephone Encounter (Signed)
Encounter closed

## 2019-01-10 NOTE — Progress Notes (Signed)
53 y.o. G23P0011 Married White or Caucasian female here for annual exam.  Cycles are regular.  Flow is very heavy with clots about three days.  Cycles then lighten and stop.  Entire flow lasts 5-7 days.  She has been experiencing more issues pelvic pressure, low back pain, and urinary urgency.  She also had an ultrasound today as she feels her fibroids must be increasing in size due to her symptoms.     Is having reflux issues.  Has undergone evaluation[ and will have video visit with Dr. Gwenlyn Found in July.  Ultrasound performed today as well.   Uterus:  11 x 6 x 9cm with four fibroids.  Largest measures 4.4cm . Uterus, number and size of fibroids have all increased. Endometrium:  39mm Left ovary:  3.6 x 1.4 x 1.3cm Right ovary:  3.1 x 2.5 x 2.6cm Cul de sac:  No free fluid  Patient's last menstrual period was 12/25/2018.          Sexually active: Yes.    The current method of family planning is condoms most of the time.    Exercising: Yes.    walking and strength training Smoker:  no  Health Maintenance: Pap:  2/18, neg HR HPV 2016 History of abnormal Pap:  yes MMG:  11/19 Colonoscopy: 2014.  Follow up 7 years. BMD:   n/a TDaP:  09/02/16 Pneumonia vaccine(s):  Not indicated Shingrix:   discussed today Hep C testing: n/a Screening Labs: 6/18   reports that she has never smoked. She has never used smokeless tobacco. She reports that she does not drink alcohol or use drugs.  Past Medical History:  Diagnosis Date  . Abnormal Pap smear 1/09   AGUS pap  . GERD (gastroesophageal reflux disease)   . H/O: C-section   . IBS (irritable bowel syndrome)    ?    Past Surgical History:  Procedure Laterality Date  . CESAREAN SECTION    . CYSTOSCOPY  4/09   in office, secondary to microscopic hematuria  . DILATION AND CURETTAGE OF UTERUS    . NASAL SEPTUM SURGERY    . TONSILLECTOMY      Current Outpatient Medications  Medication Sig Dispense Refill  . Azelastine HCl (ASTELIN NA) Place  into the nose daily.     . Cholecalciferol (VITAMIN D PO) Take 2,000 Units by mouth daily.     . fexofenadine (ALLEGRA) 60 MG tablet Take 60 mg by mouth daily.    . fluticasone (FLONASE) 50 MCG/ACT nasal spray Place 2 sprays into the nose daily.    . Magnesium 200 MG TABS Take 200 mg by mouth daily.    Marland Kitchen PROAIR HFA 108 (90 Base) MCG/ACT inhaler     . Probiotic Product (SOLUBLE FIBER/PROBIOTICS PO) Take by mouth daily.    Marland Kitchen QVAR 80 MCG/ACT inhaler 1 puff daily.     No current facility-administered medications for this visit.     Family History  Problem Relation Age of Onset  . Hypertension Mother   . Osteoporosis Mother   . Colon polyps Mother   . Hypertension Father   . Heart disease Father   . Osteoporosis Sister   . Breast cancer Maternal Grandmother   . Rheum arthritis Sister        age 34  . Arthritis Sister        central spinal cord syndrome age 13    Review of Systems  All other systems reviewed and are negative.   Exam:  BP 128/70   Pulse 68   Temp 98 F (36.7 C) (Temporal)   Ht 5\' 5"  (1.651 m)   Wt 145 lb (65.8 kg)   LMP 12/25/2018   BMI 24.13 kg/m    Height: 5\' 5"  (165.1 cm)  Ht Readings from Last 3 Encounters:  01/10/19 5\' 5"  (1.651 m)  12/08/17 5\' 5"  (1.651 m)  10/27/16 5\' 5"  (1.651 m)    General appearance: alert, cooperative and appears stated age Head: Normocephalic, without obvious abnormality, atraumatic Neck: no adenopathy, supple, symmetrical, trachea midline and thyroid normal to inspection and palpation Lungs: clear to auscultation bilaterally Breasts: normal appearance, no masses or tenderness Heart: regular rate and rhythm Abdomen: soft, non-tender; bowel sounds normal; no masses,  no organomegaly Extremities: extremities normal, atraumatic, no cyanosis or edema Skin: Skin color, texture, turgor normal. No rashes or lesions Lymph nodes: Cervical, supraclavicular, and axillary nodes normal. No abnormal inguinal nodes palpated  Neurologic: Grossly normal   Pelvic: External genitalia:  no lesions              Urethra:  normal appearing urethra with no masses, tenderness or lesions              Bartholins and Skenes: normal                 Vagina: normal appearing vagina with normal color and discharge, no lesions              Cervix: no lesions              Pap taken: Yes.   Bimanual Exam:  Uterus:  enlarged, 10-12 weeks size              Adnexa: normal adnexa and no mass, fullness, tenderness               Rectovaginal: Confirms               Anus:  normal sphincter tone, no lesions  Chaperone was present for exam.  A:  Well Woman with normal exam Fibroid uterus Menorrhagia that is stable H/O GERD and IBS H/o BBB  P:   Mammogram guidelines reviewed pap smear and HR HPV obtained today Lab work done last week Colonoscopy done 2014, due 2021 Shingrix discussed She does not desire any treatment at this time for her bleeding or fibroids but knows to call if changes mind.  Reviewed options again for treatment.  She would be most interested in [place] or hysterectomy if decides to proceed return annually or prn

## 2019-01-10 NOTE — Telephone Encounter (Signed)
Yes.  It's on the schedule today and that is great.  Thanks.

## 2019-01-15 ENCOUNTER — Ambulatory Visit: Payer: 59 | Admitting: Obstetrics & Gynecology

## 2019-01-15 LAB — CYTOLOGY - PAP
Adequacy: ABSENT
Diagnosis: NEGATIVE
HPV: NOT DETECTED

## 2019-01-22 DIAGNOSIS — R51 Headache: Secondary | ICD-10-CM | POA: Diagnosis not present

## 2019-01-22 DIAGNOSIS — Z20828 Contact with and (suspected) exposure to other viral communicable diseases: Secondary | ICD-10-CM | POA: Diagnosis not present

## 2019-02-01 ENCOUNTER — Telehealth: Payer: Managed Care, Other (non HMO) | Admitting: Cardiovascular Disease

## 2019-02-19 DIAGNOSIS — H40053 Ocular hypertension, bilateral: Secondary | ICD-10-CM | POA: Diagnosis not present

## 2019-02-26 DIAGNOSIS — J3081 Allergic rhinitis due to animal (cat) (dog) hair and dander: Secondary | ICD-10-CM | POA: Diagnosis not present

## 2019-02-26 DIAGNOSIS — Z91013 Allergy to seafood: Secondary | ICD-10-CM | POA: Diagnosis not present

## 2019-02-26 DIAGNOSIS — J3089 Other allergic rhinitis: Secondary | ICD-10-CM | POA: Diagnosis not present

## 2019-02-26 DIAGNOSIS — J4540 Moderate persistent asthma, uncomplicated: Secondary | ICD-10-CM | POA: Diagnosis not present

## 2019-02-26 DIAGNOSIS — K219 Gastro-esophageal reflux disease without esophagitis: Secondary | ICD-10-CM | POA: Diagnosis not present

## 2019-02-26 DIAGNOSIS — R142 Eructation: Secondary | ICD-10-CM | POA: Diagnosis not present

## 2019-02-26 DIAGNOSIS — R079 Chest pain, unspecified: Secondary | ICD-10-CM | POA: Diagnosis not present

## 2019-02-26 DIAGNOSIS — R935 Abnormal findings on diagnostic imaging of other abdominal regions, including retroperitoneum: Secondary | ICD-10-CM | POA: Diagnosis not present

## 2019-03-12 DIAGNOSIS — J019 Acute sinusitis, unspecified: Secondary | ICD-10-CM | POA: Diagnosis not present

## 2019-03-15 ENCOUNTER — Ambulatory Visit (INDEPENDENT_AMBULATORY_CARE_PROVIDER_SITE_OTHER): Payer: BC Managed Care – PPO | Admitting: Cardiovascular Disease

## 2019-03-15 ENCOUNTER — Other Ambulatory Visit: Payer: Self-pay

## 2019-03-15 ENCOUNTER — Encounter: Payer: Self-pay | Admitting: Cardiovascular Disease

## 2019-03-15 DIAGNOSIS — I4510 Unspecified right bundle-branch block: Secondary | ICD-10-CM | POA: Diagnosis not present

## 2019-03-15 DIAGNOSIS — Z8249 Family history of ischemic heart disease and other diseases of the circulatory system: Secondary | ICD-10-CM | POA: Diagnosis not present

## 2019-03-15 NOTE — Assessment & Plan Note (Signed)
Father who had CABG in his 75s

## 2019-03-15 NOTE — Progress Notes (Signed)
03/15/2019 Laurie Flores   06-10-1966  [id number]  Primary Physician Ryter-Brown, Shyrl Numbers, MD Primary Cardiologist: Lorretta Harp MD Lupe Carney, [place]  HPI:  Laurie Flores is a 53 y.o. thin appearing married Caucasian female mother of 1 child referred by Dr. Judeen Hammans Ryter-Brown to be reestablished in the practice because of chronic robust branch block.  She works in an Therapist, art for [employer]  I saw her in my office many years ago and she is here to reestablish referred by her primary care doctor.  She has no cardiac risk factors other than family history with a father who had CABG in his 78s.  She is never had a heart attack or stroke.  She denies chest pain or shortness of breath.  She walks on his weights 4-5 times a week 20 to 40 minutes at a time without symptoms.  She does have reactive airways disease.   Current Meds  Medication Sig  . Azelastine HCl (ASTELIN NA) Place into the nose daily.   . Cholecalciferol (VITAMIN D PO) Take 2,000 Units by mouth daily.   . fexofenadine (ALLEGRA) 60 MG tablet Take 60 mg by mouth daily.  . fluticasone (FLONASE) 50 MCG/ACT nasal spray Place 2 sprays into the nose daily.  . Magnesium 200 MG TABS Take 200 mg by mouth daily.  Marland Kitchen omeprazole (PRILOSEC) 20 MG capsule Take 20 mg by mouth daily.  Marland Kitchen PROAIR HFA 108 (90 Base) MCG/ACT inhaler   . Probiotic Product (SOLUBLE FIBER/PROBIOTICS PO) Take by mouth daily.  Marland Kitchen QVAR 80 MCG/ACT inhaler 1 puff daily.     Allergies  Allergen Reactions  . Amoxicillin-Pot Clavulanate Rash  . Ciprofloxacin Rash  . Shellfish Allergy Nausea And Vomiting  . Sulfa Antibiotics Hives    Social History   Socioeconomic History  . Marital status: Married    Spouse name: Not on file  . Number of children: Not on file  . Years of education: Not on file  . Highest education level: Not on file  Occupational History  . Not on file  Social Needs  . Financial resource strain:  Not on file  . Food insecurity    Worry: Not on file    Inability: Not on file  . Transportation needs    Medical: Not on file    Non-medical: Not on file  Tobacco Use  . Smoking status: Never Smoker  . Smokeless tobacco: Never Used  Substance and Sexual Activity  . Alcohol use: No  . Drug use: No  . Sexual activity: Yes    Partners: Male    Birth control/protection: Condom  Lifestyle  . Physical activity    Days per week: Not on file    Minutes per session: Not on file  . Stress: Not on file  Relationships  . Social Herbalist on phone: Not on file    Gets together: Not on file    Attends religious service: Not on file    Active member of club or organization: Not on file    Attends meetings of clubs or organizations: Not on file    Relationship status: Not on file  . Intimate partner violence    Fear of current or ex partner: Not on file    Emotionally abused: Not on file    Physically abused: Not on file    Forced sexual activity: Not on file  Other Topics Concern  . Not on file  Social History Narrative  . Not on file     Review of Systems: General: negative for chills, fever, night sweats or weight changes.  Cardiovascular: negative for chest pain, dyspnea on exertion, edema, orthopnea, palpitations, paroxysmal nocturnal dyspnea or shortness of breath Dermatological: negative for rash Respiratory: negative for cough or wheezing Urologic: negative for hematuria Abdominal: negative for nausea, vomiting, diarrhea, bright red blood per rectum, melena, or hematemesis Neurologic: negative for visual changes, syncope, or dizziness All other systems reviewed and are otherwise negative except as noted above.    Blood pressure 130/66, pulse 70, temperature (!) 97.1 F (36.2 C), height 5\' 5"  (1.651 m), weight 140 lb (63.5 kg).  General appearance: alert and no distress Neck: no adenopathy, no carotid bruit, no JVD, supple, symmetrical, trachea midline and  thyroid not enlarged, symmetric, no tenderness/mass/nodules Lungs: clear to auscultation bilaterally Heart: regular rate and rhythm, S1, S2 normal, no murmur, click, rub or gallop Extremities: extremities normal, atraumatic, no cyanosis or edema Pulses: 2+ and symmetric Skin: Skin color, texture, turgor normal. No rashes or lesions Neurologic: Alert and oriented X 3, normal strength and tone. Normal symmetric reflexes. Normal coordination and gait  EKG normal sinus rhythm at 70 with incomplete right bundle branch block.  I personally reviewed this EKG.  ASSESSMENT AND PLAN:   Right bundle branch block Chronic  Family history of heart disease Father who had CABG in his 24s      Lorretta Harp MD [facility], [facility] 03/15/2019 9:54 AM

## 2019-03-15 NOTE — Patient Instructions (Signed)
Medication Instructions:  Your physician recommends that you continue on your current medications as directed. Please refer to the Current Medication list given to you today.  If you need a refill on your cardiac medications before your next appointment, please call your pharmacy.   Lab work: none If you have labs (blood work) drawn today and your tests are completely normal, you will receive your results only by: Marland Kitchen MyChart Message (if you have MyChart) OR . A paper copy in the mail If you have any lab test that is abnormal or we need to change your treatment, we will call you to review the results.  Testing/Procedures: none  Follow-Up: At [facility], you and your health needs are our priority.  As part of our continuing mission to provide you with exceptional heart care, we have created designated Provider Care Teams.  These Care Teams include your primary Cardiologist (physician) and Advanced Practice Providers (APPs -  Physician Assistants and Nurse Practitioners) who all work together to provide you with the care you need, when you need it. . You will need a follow up appointment as needed. You may see Dr. Gwenlyn Found or one of the following Advanced Practice Providers on your designated Care Team:   . Kerin Ransom, PA-C . Daleen Snook Kroeger, PA-C . Sande Rives, PA-C . Almyra Deforest, PA-C . Fabian Sharp, PA-C . Jory Sims, DNP . Rosaria Ferries, PA-C

## 2019-03-15 NOTE — Assessment & Plan Note (Signed)
Chronic. 

## 2019-03-20 DIAGNOSIS — J45909 Unspecified asthma, uncomplicated: Secondary | ICD-10-CM | POA: Diagnosis not present

## 2019-03-20 DIAGNOSIS — J3081 Allergic rhinitis due to animal (cat) (dog) hair and dander: Secondary | ICD-10-CM | POA: Diagnosis not present

## 2019-03-20 DIAGNOSIS — J301 Allergic rhinitis due to pollen: Secondary | ICD-10-CM | POA: Diagnosis not present

## 2019-03-20 DIAGNOSIS — J3089 Other allergic rhinitis: Secondary | ICD-10-CM | POA: Diagnosis not present

## 2019-04-12 DIAGNOSIS — Z13220 Encounter for screening for lipoid disorders: Secondary | ICD-10-CM | POA: Diagnosis not present

## 2019-04-12 DIAGNOSIS — Z0000 Encounter for general adult medical examination without abnormal findings: Secondary | ICD-10-CM | POA: Diagnosis not present

## 2019-04-16 DIAGNOSIS — J3089 Other allergic rhinitis: Secondary | ICD-10-CM | POA: Diagnosis not present

## 2019-04-16 DIAGNOSIS — J3081 Allergic rhinitis due to animal (cat) (dog) hair and dander: Secondary | ICD-10-CM | POA: Diagnosis not present

## 2019-04-19 DIAGNOSIS — Z0000 Encounter for general adult medical examination without abnormal findings: Secondary | ICD-10-CM | POA: Diagnosis not present

## 2019-04-19 DIAGNOSIS — J301 Allergic rhinitis due to pollen: Secondary | ICD-10-CM | POA: Diagnosis not present

## 2019-04-19 DIAGNOSIS — K219 Gastro-esophageal reflux disease without esophagitis: Secondary | ICD-10-CM | POA: Diagnosis not present

## 2019-04-19 DIAGNOSIS — J45909 Unspecified asthma, uncomplicated: Secondary | ICD-10-CM | POA: Diagnosis not present

## 2019-04-25 DIAGNOSIS — J3081 Allergic rhinitis due to animal (cat) (dog) hair and dander: Secondary | ICD-10-CM | POA: Diagnosis not present

## 2019-04-25 DIAGNOSIS — J3089 Other allergic rhinitis: Secondary | ICD-10-CM | POA: Diagnosis not present

## 2019-04-30 DIAGNOSIS — J322 Chronic ethmoidal sinusitis: Secondary | ICD-10-CM | POA: Diagnosis not present

## 2019-04-30 DIAGNOSIS — J329 Chronic sinusitis, unspecified: Secondary | ICD-10-CM | POA: Diagnosis not present

## 2019-05-02 DIAGNOSIS — J3081 Allergic rhinitis due to animal (cat) (dog) hair and dander: Secondary | ICD-10-CM | POA: Diagnosis not present

## 2019-05-02 DIAGNOSIS — J3089 Other allergic rhinitis: Secondary | ICD-10-CM | POA: Diagnosis not present

## 2019-05-14 DIAGNOSIS — J3081 Allergic rhinitis due to animal (cat) (dog) hair and dander: Secondary | ICD-10-CM | POA: Diagnosis not present

## 2019-05-14 DIAGNOSIS — J3089 Other allergic rhinitis: Secondary | ICD-10-CM | POA: Diagnosis not present

## 2019-05-21 DIAGNOSIS — J3089 Other allergic rhinitis: Secondary | ICD-10-CM | POA: Diagnosis not present

## 2019-05-21 DIAGNOSIS — J3081 Allergic rhinitis due to animal (cat) (dog) hair and dander: Secondary | ICD-10-CM | POA: Diagnosis not present

## 2019-05-24 DIAGNOSIS — J310 Chronic rhinitis: Secondary | ICD-10-CM | POA: Diagnosis not present

## 2019-05-24 DIAGNOSIS — J343 Hypertrophy of nasal turbinates: Secondary | ICD-10-CM | POA: Diagnosis not present

## 2019-05-24 DIAGNOSIS — H6121 Impacted cerumen, right ear: Secondary | ICD-10-CM | POA: Diagnosis not present

## 2019-05-28 DIAGNOSIS — J3089 Other allergic rhinitis: Secondary | ICD-10-CM | POA: Diagnosis not present

## 2019-05-28 DIAGNOSIS — J3081 Allergic rhinitis due to animal (cat) (dog) hair and dander: Secondary | ICD-10-CM | POA: Diagnosis not present

## 2019-06-06 DIAGNOSIS — J3089 Other allergic rhinitis: Secondary | ICD-10-CM | POA: Diagnosis not present

## 2019-06-06 DIAGNOSIS — J3081 Allergic rhinitis due to animal (cat) (dog) hair and dander: Secondary | ICD-10-CM | POA: Diagnosis not present

## 2019-06-18 DIAGNOSIS — J3089 Other allergic rhinitis: Secondary | ICD-10-CM | POA: Diagnosis not present

## 2019-06-18 DIAGNOSIS — J3081 Allergic rhinitis due to animal (cat) (dog) hair and dander: Secondary | ICD-10-CM | POA: Diagnosis not present

## 2019-07-02 DIAGNOSIS — J3089 Other allergic rhinitis: Secondary | ICD-10-CM | POA: Diagnosis not present

## 2019-07-02 DIAGNOSIS — J3081 Allergic rhinitis due to animal (cat) (dog) hair and dander: Secondary | ICD-10-CM | POA: Diagnosis not present

## 2019-09-17 ENCOUNTER — Telehealth: Payer: Self-pay | Admitting: Obstetrics & Gynecology

## 2019-09-17 NOTE — Telephone Encounter (Signed)
Patient left a message on the answering with a questions for Dr.Miller's nurse. No details given.

## 2019-09-17 NOTE — Telephone Encounter (Signed)
Spoke with patient. Patient reports Hx of fibroids, was seen in office on 01/10/19 for AEX and PUS, discussed possible tx options. Patient reports a normal menses approximately 07/30/19. Reports intermittent pain in pelvis down into groin, states this is the same pain she has experienced in the past, has discussed with Dr. Sabra Heck. Denies any pain today. Denies any other symptoms. Patient is asking of this will resolve with menopause? She has not considered tx options yet. Advised I will review with Dr. Sabra Heck and f/u with recommendations, patient agreeable.   Routing to Dr. Sabra Heck to review.

## 2019-09-18 NOTE — Telephone Encounter (Signed)
Yes, typically this does all resolve with menopause.  I'm happy to talk with her at anytime about options again.  Thanks.

## 2019-09-18 NOTE — Telephone Encounter (Signed)
Left message to call Lucylle Foulkes, RN at [facility] [phone].   

## 2019-09-19 NOTE — Telephone Encounter (Signed)
Patient returned a call to Jill.   

## 2019-09-20 NOTE — Telephone Encounter (Signed)
Spoke with patient. Advised per Dr. Sabra Heck. Patient declines OV at this time, denies any new or worsening symptoms. Patient states she will continue to monitor, will call for OV if any changes or desire to discuss tx. Patient thankful for call.   Routing to provider for final review. Patient is agreeable to disposition. Will close encounter.

## 2019-10-07 ENCOUNTER — Telehealth: Payer: Self-pay

## 2019-10-07 NOTE — Telephone Encounter (Signed)
Patient returned call

## 2019-10-07 NOTE — Telephone Encounter (Signed)
Patient called regarding low abdominal pain due to fibroids. Patient would like to speak to a nurse regarding making an appointment.

## 2019-10-07 NOTE — Telephone Encounter (Signed)
Spoke back with pt. Pt states has monitored symptoms since 09/20/2019 per phone encounter dated 09/17/2019  and now is requesting an OV to discuss treatment for pain. Pt scheduled with Dr Sabra Heck on 10/10/2019 at 2:30 pm. Pt agreeable.   Routing to Dr Sabra Heck for review and will close encounter.

## 2019-10-07 NOTE — Telephone Encounter (Signed)
Left message for pt to call back to triage RN.  

## 2019-10-09 ENCOUNTER — Other Ambulatory Visit: Payer: Self-pay

## 2019-10-10 ENCOUNTER — Ambulatory Visit: Payer: BC Managed Care – PPO | Admitting: Obstetrics & Gynecology

## 2019-10-10 ENCOUNTER — Encounter: Payer: Self-pay | Admitting: Obstetrics & Gynecology

## 2019-10-10 VITALS — BP 132/70 | HR 72 | Temp 98.2°F | Ht 65.0 in | Wt 143.0 lb

## 2019-10-10 DIAGNOSIS — M545 Low back pain, unspecified: Secondary | ICD-10-CM

## 2019-10-10 DIAGNOSIS — D251 Intramural leiomyoma of uterus: Secondary | ICD-10-CM

## 2019-10-10 MED ORDER — MEDROXYPROGESTERONE ACETATE 10 MG PO TABS: 10.0000 mg | ORAL_TABLET | Freq: Every day | ORAL | 0 refills | Status: DC

## 2019-10-10 NOTE — Progress Notes (Signed)
GYNECOLOGY  VISIT  CC:   Patient states that she has had constant pain from fibroids for [redacted] weeks along with lower abdominal "fullness".  HPI: 54 y.o. G72P0011 Married White or Caucasian female here for pain from fibroids.  Pt had a normal menstrual cycle starting 07/30/2019 that lasted about 6 days.  A few days later, she felt like she passed some additional tissue that was dark looking.     Since the last cycle, she's had increased low back pain, sensation of fulness in the pelvis and bloating.  She's also having a lot of breast pain as well.  Feels hormonal.  Does have hx of fibroids and wonders if this is getting worse and it is time for surgery.  Last PUS was 01/10/2019.  Uterus was 11 x 6 x 9cm with 4 fibroids, largest measuring 4.4cm.  Last pap smear 12/2018 was normal with neg HR HPV.    GYNECOLOGIC HISTORY: Patient's last menstrual period was 07/30/2019. Contraception: none Menopausal hormone therapy: none  Patient Active Problem List   Diagnosis Date Noted  . Right bundle branch block 03/15/2019  . Family history of heart disease 03/15/2019  . Vitamin D deficiency 10/16/2017  . Porokeratosis 02/14/2017  . Plantar fasciitis 02/14/2017  . Intramural and subserous leiomyoma of uterus 10/29/2016  . Anal fissure 10/29/2016  . ANXIETY 09/06/2007  . ALLERGIC RHINITIS, SEASONAL 09/06/2007  . ASTHMA 09/06/2007  . CONSTIPATION, CHRONIC 09/06/2007  . ARTHRALGIA 09/06/2007  . EPIGASTRIC PAIN 09/06/2007  . Asthma 09/06/2007  . IRRITABLE BOWEL SYNDROME 04/18/2007  . GERD 09/19/2004  . External hemorrhoids 11/19/2002    Past Medical History:  Diagnosis Date  . Abnormal Pap smear 1/09   AGUS pap  . Atypical nevus 01/28/2009   Right Upper Back -Moderate  . Atypical nevus 05/04/2010   Right Low Paraspinal-Minimal, Left Mid Paraspinal-Mild to Moderate and Right Upper Paraspinal-Minimal  . Atypical nevus 11/30/2011   Right Upper Paraspinal-Mild to Moderate  . Atypical nevus  12/03/2013   Right Upper Back-Mild, and Right Lower Scapula-Moderate(widershave)  . Atypical nevus 06/07/2016   Right Post Shoulder-Mild  . GERD (gastroesophageal reflux disease)   . H/O: C-section   . IBS (irritable bowel syndrome)    ?    Past Surgical History:  Procedure Laterality Date  . CESAREAN SECTION    . CYSTOSCOPY  4/09   in office, secondary to microscopic hematuria  . DILATION AND CURETTAGE OF UTERUS    . NASAL SEPTUM SURGERY    . TONSILLECTOMY      MEDS:   Current Outpatient Medications on File Prior to Visit  Medication Sig Dispense Refill  . Azelastine HCl (ASTELIN NA) Place into the nose daily.     . Cholecalciferol (VITAMIN D PO) Take 2,000 Units by mouth daily.     . fexofenadine (ALLEGRA) 60 MG tablet Take 60 mg by mouth daily.    . fluticasone (FLONASE) 50 MCG/ACT nasal spray Place 2 sprays into the nose daily.    . Magnesium 200 MG TABS Take 200 mg by mouth daily.    . Olopatadine HCl (PAZEO) 0.7 % SOLN Place 1 drop into both eyes every morning.    Marland Kitchen omeprazole (PRILOSEC) 20 MG capsule Take 20 mg by mouth daily.    Marland Kitchen PROAIR HFA 108 (90 Base) MCG/ACT inhaler     . Probiotic Product (SOLUBLE FIBER/PROBIOTICS PO) Take by mouth daily.    Marland Kitchen QVAR 80 MCG/ACT inhaler 1 puff daily.     No current facility-administered  medications on file prior to visit.    ALLERGIES: Amoxicillin-pot clavulanate, Ciprofloxacin, Shellfish allergy, and Sulfa antibiotics  Family History  Problem Relation Age of Onset  . Hypertension Mother   . Osteoporosis Mother   . Colon polyps Mother   . Hypertension Father   . Heart disease Father   . Osteoporosis Sister   . Breast cancer Maternal Grandmother   . Rheum arthritis Sister        age 14  . Arthritis Sister        central spinal cord syndrome age 77    SH:  Married, non smoker  Review of Systems  Gastrointestinal: Positive for abdominal pain.  All other systems reviewed and are negative.   PHYSICAL EXAMINATION:     BP 132/70 (BP Location: Right Arm, Patient Position: Sitting, Cuff Size: Normal)   Pulse 72   Temp 98.2 F (36.8 C) (Temporal)   Ht 5\' 5"  (1.651 m)   Wt 143 lb (64.9 kg)   LMP 07/30/2019   BMI 23.80 kg/m     General appearance: alert, cooperative and appears stated age Abdomen: soft, non-tender; bowel sounds normal; no masses,  no organomegaly Lymph:  no inguinal LAD noted  Pelvic: External genitalia:  no lesions              Urethra:  normal appearing urethra with no masses, tenderness or lesions              Bartholins and Skenes: normal                 Vagina: normal appearing vagina with normal color and discharge, no lesions              Cervix: no lesions              Bimanual Exam:  Uterus:  enlarged, about 12 weeks size, mobile, non tender weeks size              Adnexa: no mass, fullness, tenderness  Chaperone, Terence Lux, CMA, was present for exam.  Assessment: Lower back pain, breast tenderness, moodiness with LMP 07/30/2019 H/o fibroids but stable exam today.  Plan: Feel pt maybe having symptoms due to perimenopause and delayed menses.  Exam is stable so I don't think this is all caused by fibroids.  Provera challenge recommended.  10mg  x 10 days.  Pt will call with onset of bleeding.     About 20 minutes total spent with pt.

## 2019-10-31 ENCOUNTER — Other Ambulatory Visit: Payer: Self-pay

## 2019-10-31 ENCOUNTER — Ambulatory Visit: Payer: BC Managed Care – PPO | Admitting: Physician Assistant

## 2019-10-31 ENCOUNTER — Encounter: Payer: Self-pay | Admitting: Physician Assistant

## 2019-10-31 DIAGNOSIS — D229 Melanocytic nevi, unspecified: Secondary | ICD-10-CM

## 2019-10-31 DIAGNOSIS — Z86018 Personal history of other benign neoplasm: Secondary | ICD-10-CM

## 2019-10-31 DIAGNOSIS — Z1283 Encounter for screening for malignant neoplasm of skin: Secondary | ICD-10-CM

## 2019-10-31 DIAGNOSIS — D485 Neoplasm of uncertain behavior of skin: Secondary | ICD-10-CM

## 2019-10-31 NOTE — Patient Instructions (Signed)

## 2019-10-31 NOTE — Progress Notes (Signed)
   Follow-Up Visit   Subjective  Laurie Flores is a 54 y.o. female who presents for the following: Annual Exam (no concerns).   Location: left side of neck  Duration: years Quality: black mole Associated Signs/Symptoms: none Modifying Factors: persistent Severity: changing  Context: History of DN   The following portions of the chart were reviewed this encounter and updated as appropriate: Tobacco  Allergies  Meds  Problems  Med Hx  Surg Hx  Fam Hx      Objective  Well appearing patient in no apparent distress; mood and affect are within normal limits.  A full examination was performed including scalp, head, eyes, ears, nose, lips, neck, chest, axillae, abdomen, back, buttocks, bilateral upper extremities, bilateral lower extremities, hands, feet, fingers, toes, fingernails, and toenails. All findings within normal limits unless otherwise noted below.  Objective  Left lateral neck: Bichromic dark nested macule.      Objective  head to toe: All scars clear   Assessment & Plan  Neoplasm of skin Left lateral neck  Skin / nail biopsy Type of biopsy: tangential   Informed consent: discussed and consent obtained   Timeout: patient name, date of birth, surgical site, and procedure verified   Anesthesia: the lesion was anesthetized in a standard fashion   Anesthetic:  1% lidocaine w/ epinephrine 1-100,000 local infiltration Instrument used: flexible razor blade   Hemostasis achieved with: aluminum chloride and electrodesiccation   Outcome: patient tolerated procedure well   Post-procedure details: wound care instructions given    Specimen 1 - Surgical pathology Differential Diagnosis: DN Check Margins: s  Screening exam for skin cancer head to toe  Yearly skin checks

## 2019-11-04 ENCOUNTER — Telehealth: Payer: Self-pay | Admitting: *Deleted

## 2019-11-04 DIAGNOSIS — D251 Intramural leiomyoma of uterus: Secondary | ICD-10-CM

## 2019-11-04 NOTE — Telephone Encounter (Signed)
I would recommend repeat PUS to compare changes from last year in 12/2018.  Has been almost a year.  If fibroids larger, than this is likely cause and we can talk about treatment options.  Thanks.

## 2019-11-04 NOTE — Telephone Encounter (Signed)
Patient calling to give nurse update on how she is doing.

## 2019-11-04 NOTE — Telephone Encounter (Signed)
Spoke with pt. OV 3/25 for pelvic pain/back pain with stable exam.  Pt to start Provera challenge.  Has Fibroids, last  PUS on 12/2018. Last AEX 01/10/19.   Pt reports after last OV on 3/25, Pt did not take Provera Rx.  Pt started cycle on own 3/30 and had "normal" cycle for 7 days with only 3-4 days of heavy bleeding with clots. Pt wore pads and did not change that often,  3-4 hours when going to bathroom only.   Pt states having some relief from pelvic pain/back pain after cycle.  Pt states now having same pelvic pain/ back pain/ "fullness" feeling today and wants to know if from fibroids and what is next steps in plan of care.   Denies vaginal bleeding since cycle and UTI sx.  Pt only taking OTC Tylenol for pain.  Will discuss with Dr Sabra Heck once in office from OR case and return call to pt with recommendations. Pt agreeable.   Routing to Dr Sabra Heck.

## 2019-11-04 NOTE — Telephone Encounter (Signed)
Spoke with pt. Pt given recommendations per Dr Sabra Heck. Pt agreeable to PUS. PUS scheduled for 11/14/2019 at 1:30 pm then OV consult with Dr Sabra Heck. Pt agreeable and verbalized understanding of date and time. Pt aware of call for benefits for PUS.   Routing to Dr Sabra Heck for review.  Encounter closed.  Cc: Alfonse Spruce for precert. Orders placed.

## 2019-11-07 ENCOUNTER — Telehealth: Payer: Self-pay | Admitting: Physician Assistant

## 2019-11-07 NOTE — Telephone Encounter (Signed)
Pathology to patient.  °

## 2019-11-07 NOTE — Telephone Encounter (Signed)
Reviewed and sent to pool already. Yearly skin check.

## 2019-11-13 ENCOUNTER — Telehealth: Payer: Self-pay

## 2019-11-13 ENCOUNTER — Other Ambulatory Visit: Payer: Self-pay

## 2019-11-13 NOTE — Telephone Encounter (Signed)
AEX 01/10/19 Has fibroids, last PUS 12/2018  Spoke with pt. Pt states started spontaneous cycle today 11/13/19. Pt was suppose to start Provera challenge per Dr Ammie Ferrier OV note on 10/10/19, but had spontaneous cycle on 10/15/19 as well. Pt has not taken any Provera.     Pt states having only clots that are size of golf balls when going to bathroom for 3-4 days of cycle. Pt states not having any "free" bleeding. Pt only changes pad once a day. Denies any other sx. Pt advised to reschedule PUS for 4/29. Pt agreeable.   Pt rescheduled PUS for 11/21/19 at 1230 pm with OV at 1 pm with Dr Sabra Heck. Pt agreeable and verbalized understanding of date and time.   Routing to Dr Sabra Heck for review. Encounter closed.

## 2019-11-13 NOTE — Telephone Encounter (Signed)
Patient called in regards to appointment tomorrow (11/14/19). Patient is scheduled for ultrasound and patient stated she has started her cycle. Patient would like to know if she can still come in for appointment.

## 2019-11-13 NOTE — Telephone Encounter (Signed)
Left message for pt to return call to triage RN. 

## 2019-11-14 ENCOUNTER — Other Ambulatory Visit: Payer: Self-pay | Admitting: Obstetrics & Gynecology

## 2019-11-14 ENCOUNTER — Other Ambulatory Visit: Payer: Self-pay

## 2019-11-21 ENCOUNTER — Ambulatory Visit: Payer: BC Managed Care – PPO | Admitting: Obstetrics & Gynecology

## 2019-11-21 ENCOUNTER — Other Ambulatory Visit: Payer: Self-pay

## 2019-11-21 ENCOUNTER — Encounter: Payer: Self-pay | Admitting: Obstetrics & Gynecology

## 2019-11-21 ENCOUNTER — Ambulatory Visit (INDEPENDENT_AMBULATORY_CARE_PROVIDER_SITE_OTHER): Payer: BC Managed Care – PPO

## 2019-11-21 VITALS — BP 110/70 | HR 68 | Temp 97.7°F | Resp 16 | Wt 145.0 lb

## 2019-11-21 DIAGNOSIS — D251 Intramural leiomyoma of uterus: Secondary | ICD-10-CM | POA: Diagnosis not present

## 2019-11-21 DIAGNOSIS — N8003 Adenomyosis of the uterus: Secondary | ICD-10-CM

## 2019-11-21 DIAGNOSIS — N800 Endometriosis of uterus: Secondary | ICD-10-CM

## 2019-11-21 DIAGNOSIS — Z3141 Encounter for fertility testing: Secondary | ICD-10-CM | POA: Diagnosis not present

## 2019-11-24 NOTE — Progress Notes (Signed)
54 y.o. G80P0011 Married White or Caucasian female here for pelvic ultrasound due to increased sensation of pelvic pressure, dysmenorrhea, increased menstrual flow and h/o fibroids.  Pt has been following conservatively but is considering more aggressive treatment options like hysterectomy because she is tired of the symptoms.  Suggested PUS to see if fibroids were changing.  She is wondering how close she is to menopause as she has been trying not to have surgery.  Options for getting an idea about this discussed.    Patient's last menstrual period was 11/13/2019 (exact date).  Contraception: condoms, most of the time  Findings:  UTERUS: 11.6 x 7.4 x 5.7cm with intramural fibroid measuring 5.0 x 4.2cm and 1.9 x 1.4cm.  Features of adenomyosis also noted.   EMS: 8.58mm, symmetric ADNEXA: Left ovary: 2.5 x 2.0 x [id number] with [id number] follicle       Right ovary: 4.2 x 3.1 x 2.7cm CUL DE SAC: no free fluid  Discussion:  Findings reviewed with pt and images reviewed as well.  Treatment options for fibroids reviewed again including OCPs, progestin options, IUD, hysterectomy.  [place] and endometrial ablation would likely not be successful due to adenomyosis.  She has many questions and these were all addressed today.  She is still undecided about what she wants to have done.    Assessment:  Dysmenorrhea Pelvic pain/pressure/low back pain Enlarging fibroid uterus Adenomyosis  Plan:  Pt is considering options and will call when has made a decision. AMH obtained today as well  ~30 minutes spent with pt discussing options for treatment, risks, benefits, recovery.

## 2019-11-27 LAB — ANTI MULLERIAN HORMONE: ANTI-MULLERIAN HORMONE (AMH): 0.09 ng/mL

## 2020-01-31 NOTE — Progress Notes (Signed)
54 y.o. G59P0011 Married White or Caucasian female here for annual exam.  Just got back from vacation at [facility].  Friends have a sizable boat that they dock in [place].  They rode the boat a lot.  LMP was in April.  Didn't have a cycle in May and June.  Has not had any pain or pressure.  This has been really great.  AMH done 11/21/2019 was 0.090.  She is having some hot flashes.          Sexually active: Yes.    The current method of family planning is condoms all the time.    Exercising: Yes.    walking & weights Smoker:  no  Health Maintenance: Pap:  01-10-2019 neg HPV HR neg History of abnormal Pap:  yes MMG:  08-02-2019 birads 2:neg Colonoscopy:  2014 f/u 63yrs  BMD:   none TDaP:  2018 Pneumonia vaccine(s):  Not done Shingrix:   D/w pt today.  Reviewed with Teodoro Kil at last visit.  Hep C testing: not done Screening Labs: poct upt-neg   reports that she has never smoked. She has never used smokeless tobacco. She reports that she does not drink alcohol and does not use drugs.  Past Medical History:  Diagnosis Date  . Abnormal Pap smear 1/09   AGUS pap  . Atypical nevus 01/28/2009   Right Upper Back -Moderate  . Atypical nevus 05/04/2010   Right Low Paraspinal-Minimal, Left Mid Paraspinal-Mild to Moderate and Right Upper Paraspinal-Minimal  . Atypical nevus 11/30/2011   Right Upper Paraspinal-Mild to Moderate  . Atypical nevus 12/03/2013   Right Upper Back-Mild, and Right Lower Scapula-Moderate(widershave)  . Atypical nevus 06/07/2016   Right Post Shoulder-Mild  . GERD (gastroesophageal reflux disease)   . H/O: C-section   . IBS (irritable bowel syndrome)    ?    Past Surgical History:  Procedure Laterality Date  . CESAREAN SECTION    . CYSTOSCOPY  4/09   in office, secondary to microscopic hematuria  . DILATION AND CURETTAGE OF UTERUS    . NASAL SEPTUM SURGERY    . TONSILLECTOMY      Current Outpatient Medications  Medication Sig Dispense Refill  .  Azelastine HCl (ASTELIN NA) Place into the nose daily.     . Cholecalciferol (VITAMIN D PO) Take 2,000 Units by mouth daily.     . fexofenadine (ALLEGRA) 60 MG tablet Take 60 mg by mouth daily.    . fluticasone (FLONASE) 50 MCG/ACT nasal spray Place 2 sprays into the nose daily.    . Magnesium 200 MG TABS Take 200 mg by mouth daily.    Marland Kitchen omeprazole (PRILOSEC) 20 MG capsule Take 20 mg by mouth daily.    Marland Kitchen PROAIR HFA 108 (90 Base) MCG/ACT inhaler     . Probiotic Product (SOLUBLE FIBER/PROBIOTICS PO) Take by mouth daily.    Marland Kitchen QVAR 80 MCG/ACT inhaler 1 puff daily.    Marland Kitchen QVAR REDIHALER 80 MCG/ACT inhaler 1 puff daily.     No current facility-administered medications for this visit.    Family History  Problem Relation Age of Onset  . Hypertension Mother   . Osteoporosis Mother   . Colon polyps Mother   . Hypertension Father   . Heart disease Father   . Osteoporosis Sister   . Breast cancer Maternal Grandmother   . Rheum arthritis Sister        age 66  . Arthritis Sister  central spinal cord syndrome age 28    Review of Systems  Constitutional: Negative.   HENT: Negative.   Eyes: Negative.   Respiratory: Negative.   Cardiovascular: Negative.   Gastrointestinal: Negative.   Endocrine: Negative.   Genitourinary:       Amenorrhea  Musculoskeletal: Negative.   Skin: Negative.   Allergic/Immunologic: Negative.   Neurological: Negative.   Hematological: Negative.   Psychiatric/Behavioral: Negative.     Exam:   BP 108/80   Pulse 72   Resp 16   Ht 5' 4.25" (1.632 m)   Wt 141 lb (64 kg)   LMP 12/10/2019 (Exact Date)   BMI 24.01 kg/m   Height: 5' 4.25" (163.2 cm)  General appearance: alert, cooperative and appears stated age Head: Normocephalic, without obvious abnormality, atraumatic Neck: no adenopathy, supple, symmetrical, trachea midline and thyroid normal to inspection and palpation Lungs: clear to auscultation bilaterally Breasts: normal appearance, no masses or  tenderness Heart: regular rate and rhythm Abdomen: soft, non-tender; bowel sounds normal; no masses,  no organomegaly Extremities: extremities normal, atraumatic, no cyanosis or edema Skin: Skin color, texture, turgor normal. No rashes or lesions Lymph nodes: Cervical, supraclavicular, and axillary nodes normal. No abnormal inguinal nodes palpated Neurologic: Grossly normal  Pelvic: External genitalia:  no lesions              Urethra:  normal appearing urethra with no masses, tenderness or lesions              Bartholins and Skenes: normal                 Vagina: normal appearing vagina with normal color and discharge, no lesions              Cervix: no lesions              Pap taken: No. Bimanual Exam:  Uterus:  enlarged, 10-12 weeks size, mobile, stable              Adnexa: normal adnexa and no mass, fullness, tenderness               Rectovaginal: Confirms               Anus:  normal sphincter tone, no lesions  Chaperone, Terence Lux, CMA, was present for exam.  A:  Well Woman with normal exam Right BBB, saw Dr. Gwenlyn Found last year.  No follow up recommended. Fibroid uterus with ~5cm fibroid noted with ultrasound 11/2019 Dysmenorrhea H/o menorrhagia H/o GERD and IBS  P:   Mammogram guidelines reviewed pap smear with HR HPV obtained 2020 Colonoscopy 2014.  Follow up recommended 7 years. Pt is aware this is due.   Lab work will be done in October Shingrix vaccination discussed Pt knows to call with any menstrual bleeding or pelvic pain changes Return annually or prn

## 2020-02-03 ENCOUNTER — Encounter: Payer: Self-pay | Admitting: Obstetrics & Gynecology

## 2020-02-03 ENCOUNTER — Other Ambulatory Visit: Payer: Self-pay

## 2020-02-03 ENCOUNTER — Ambulatory Visit: Payer: BC Managed Care – PPO | Admitting: Obstetrics & Gynecology

## 2020-02-03 VITALS — BP 108/80 | HR 72 | Resp 16 | Ht 64.25 in | Wt 141.0 lb

## 2020-02-03 DIAGNOSIS — N912 Amenorrhea, unspecified: Secondary | ICD-10-CM

## 2020-02-03 DIAGNOSIS — Z01419 Encounter for gynecological examination (general) (routine) without abnormal findings: Secondary | ICD-10-CM

## 2020-02-03 LAB — POCT URINE PREGNANCY: Preg Test, Ur: NEGATIVE

## 2020-10-27 ENCOUNTER — Other Ambulatory Visit: Payer: Self-pay

## 2020-10-27 ENCOUNTER — Ambulatory Visit: Payer: BC Managed Care – PPO | Admitting: Physician Assistant

## 2020-10-27 ENCOUNTER — Encounter: Payer: Self-pay | Admitting: Physician Assistant

## 2020-10-27 DIAGNOSIS — Z1283 Encounter for screening for malignant neoplasm of skin: Secondary | ICD-10-CM | POA: Diagnosis not present

## 2020-10-27 DIAGNOSIS — L578 Other skin changes due to chronic exposure to nonionizing radiation: Secondary | ICD-10-CM

## 2020-10-27 DIAGNOSIS — L814 Other melanin hyperpigmentation: Secondary | ICD-10-CM

## 2020-10-27 DIAGNOSIS — Z86018 Personal history of other benign neoplasm: Secondary | ICD-10-CM | POA: Diagnosis not present

## 2020-10-27 DIAGNOSIS — L821 Other seborrheic keratosis: Secondary | ICD-10-CM | POA: Diagnosis not present

## 2020-10-27 DIAGNOSIS — D1800 Hemangioma unspecified site: Secondary | ICD-10-CM

## 2020-10-27 NOTE — Progress Notes (Signed)
   Follow-Up Visit   Subjective  Laurie Flores is a 55 y.o. female who presents for the following: Annual Exam (Patient here today for yearly skin check no concerns. Patient has a history of atypical moles. No personal or family history of melanoma or non mole skin cancer.).   The following portions of the chart were reviewed this encounter and updated as appropriate:  Tobacco  Allergies  Meds  Problems  Med Hx  Surg Hx  Fam Hx      Objective  Well appearing patient in no apparent distress; mood and affect are within normal limits.  A full examination was performed including scalp, head, eyes, ears, nose, lips, neck, chest, axillae, abdomen, back, buttocks, bilateral upper extremities, bilateral lower extremities, hands, feet, fingers, toes, fingernails, and toenails. All findings within normal limits unless otherwise noted below.  Objective  Right Parotid Area: Large tan macule  Objective  Right Shoulder - Posterior: Dyspigmented scar.    Assessment & Plan  Lentigo Right Parotid Area  observe  History of atypical skin mole Right Shoulder - Posterior  Yearly skin check  Lentigines - Scattered tan macules - Discussed due to sun exposure - Benign, observe - Call for any changes  Seborrheic Keratoses - Stuck-on, waxy, tan-brown papules and plaques  - Discussed benign etiology and prognosis. - Observe - Call for any changes  Hemangiomas - Red papules - Discussed benign nature - Observe - Call for any changes  Actinic Damage - diffuse scaly erythematous macules with underlying dyspigmentation - Recommend daily broad spectrum sunscreen SPF 30+ to sun-exposed areas, reapply every 2 hours as needed.  - Call for new or changing lesions.  Skin cancer screening performed today.   I, Jalise Zawistowski, PA-C, have reviewed all documentation's for this visit.  The documentation on 10/27/20 for the exam, diagnosis, procedures and orders are all accurate and  complete.

## 2020-10-30 ENCOUNTER — Ambulatory Visit: Payer: BC Managed Care – PPO | Admitting: Physician Assistant

## 2021-02-04 ENCOUNTER — Other Ambulatory Visit: Payer: Self-pay

## 2021-02-04 ENCOUNTER — Ambulatory Visit (INDEPENDENT_AMBULATORY_CARE_PROVIDER_SITE_OTHER): Payer: BC Managed Care – PPO | Admitting: Obstetrics & Gynecology

## 2021-02-04 ENCOUNTER — Encounter (HOSPITAL_BASED_OUTPATIENT_CLINIC_OR_DEPARTMENT_OTHER): Payer: Self-pay | Admitting: Obstetrics & Gynecology

## 2021-02-04 VITALS — BP 128/78 | HR 68 | Ht 64.25 in | Wt 141.4 lb

## 2021-02-04 DIAGNOSIS — Z86018 Personal history of other benign neoplasm: Secondary | ICD-10-CM

## 2021-02-04 DIAGNOSIS — Z8742 Personal history of other diseases of the female genital tract: Secondary | ICD-10-CM

## 2021-02-04 DIAGNOSIS — Z01419 Encounter for gynecological examination (general) (routine) without abnormal findings: Secondary | ICD-10-CM | POA: Diagnosis not present

## 2021-02-04 DIAGNOSIS — I4510 Unspecified right bundle-branch block: Secondary | ICD-10-CM

## 2021-02-04 DIAGNOSIS — N951 Menopausal and female climacteric states: Secondary | ICD-10-CM

## 2021-02-04 NOTE — Patient Instructions (Addendum)
Gabapentin starting at 100mg  at night.  Remember this is very low dosage compared to standard dosing.   Paxil can also be used starting at 7.5 -10mg  dosing.    Vit E vaginal suppositories Replens vaginal moisturizer Hyaluronic acid vaginally Coconut oil

## 2021-02-04 NOTE — Progress Notes (Signed)
55 y.o. G70P0011 Married White or Caucasian female here for annual exam.  Hasn't cycled since March, 2022.  Still having some fullness and pressure.  Now having hot flashes and night sweats.  Having some mild pain with intercourse.  Not exactly feeling dry, just a little uncomfortable.    Patient's last menstrual period was 09/15/2020 (approximate).          Sexually active: No.  The current method of family planning is condoms.    Exercising: Yes.     Walking and weight bearing exercise Smoker:  no  Health Maintenance: Pap:  01/10/19 neg History of abnormal Pap:  yes, remote hx MMG:  08/2020 Colonoscopy:  2014, follow up 10 years BMD:   not indicated TDaP:  09/02/16 Pneumonia vaccine(s):  2018 Shingrix:   discussed Hep C testing: does lab work with PCP Screening Labs: does lab work with PCP   reports that she has never smoked. She has never used smokeless tobacco. She reports that she does not drink alcohol and does not use drugs.  Past Medical History:  Diagnosis Date   Abnormal Pap smear 1/09   AGUS pap   Atypical nevus 01/28/2009   Right Upper Back -Moderate   Atypical nevus 05/04/2010   Right Low Paraspinal-Minimal, Left Mid Paraspinal-Mild to Moderate and Right Upper Paraspinal-Minimal   Atypical nevus 11/30/2011   Right Upper Paraspinal-Mild to Moderate   Atypical nevus 12/03/2013   Right Upper Back-Mild, and Right Lower Scapula-Moderate(widershave)   Atypical nevus 06/07/2016   Right Post Shoulder-Mild   GERD (gastroesophageal reflux disease)    H/O: C-section    IBS (irritable bowel syndrome)    ?    Past Surgical History:  Procedure Laterality Date   CESAREAN SECTION     CYSTOSCOPY  4/09   in office, secondary to microscopic hematuria   DILATION AND CURETTAGE OF UTERUS     NASAL SEPTUM SURGERY     TONSILLECTOMY      Current Outpatient Medications  Medication Sig Dispense Refill   Azelastine HCl (ASTELIN NA) Place into the nose daily.      Cholecalciferol  (VITAMIN D PO) Take 2,000 Units by mouth daily.      famotidine (PEPCID) 20 MG tablet Take 20 mg by mouth at bedtime.     fluticasone (FLONASE) 50 MCG/ACT nasal spray Place 2 sprays into the nose daily.     Magnesium 200 MG TABS Take 200 mg by mouth daily.     omeprazole (PRILOSEC) 20 MG capsule Take 20 mg by mouth daily.     PROAIR HFA 108 (90 Base) MCG/ACT inhaler      Probiotic Product (SOLUBLE FIBER/PROBIOTICS PO) Take by mouth daily.     QVAR REDIHALER 80 MCG/ACT inhaler 1 puff daily.     fexofenadine (ALLEGRA) 60 MG tablet Take 60 mg by mouth daily. (Patient not taking: Reported on 02/04/2021)     No current facility-administered medications for this visit.    Family History  Problem Relation Age of Onset   Hypertension Mother    Osteoporosis Mother    Colon polyps Mother    Hypertension Father    Heart disease Father    Osteoporosis Sister    Breast cancer Maternal Grandmother    Rheum arthritis Sister        age 35   Arthritis Sister        central spinal cord syndrome age 70    Review of Systems  Constitutional: Negative.   Gastrointestinal: Negative.  Genitourinary:  Positive for vaginal pain (with intercourse).   Exam:   Pulse 68   Ht 5' 4.25" (1.632 m)   Wt 141 lb 6.4 oz (64.1 kg)   LMP 09/15/2020 (Approximate) Comment: had some spotting in May 2022  BMI 24.08 kg/m   Height: 5' 4.25" (163.2 cm)  General appearance: alert, cooperative and appears stated age Head: Normocephalic, without obvious abnormality, atraumatic Neck: no adenopathy, supple, symmetrical, trachea midline and thyroid normal to inspection and palpation Lungs: clear to auscultation bilaterally Breasts: normal appearance, no masses or tenderness Heart: regular rate and rhythm Abdomen: soft, non-tender; bowel sounds normal; no masses,  no organomegaly Extremities: extremities normal, atraumatic, no cyanosis or edema Skin: Skin color, texture, turgor normal. No rashes or lesions Lymph nodes:  Cervical, supraclavicular, and axillary nodes normal. No abnormal inguinal nodes palpated Neurologic: Grossly normal   Pelvic: External genitalia:  no lesions              Urethra:  normal appearing urethra with no masses, tenderness or lesions              Bartholins and Skenes: normal                 Vagina: normal appearing vagina with normal color and no discharge, no lesions              Cervix: no lesions              Pap taken: No. Bimanual Exam:  Uterus:  enlarged, 10 weeks size              Adnexa: no mass, fullness, tenderness               Rectovaginal: Confirms               Anus:  normal sphincter tone, no lesions  Chaperone, Octaviano Batty, CMA, was present for exam.  Assessment/Plan: 1. Well woman exam with routine gynecological exam - pap neg with neg HR HPV 2020.  Plan repeat next year. - MMG 08/2020 - colonoscopy 2014, follow up 10 years - plan BMD closer to age 74 - pt will have blood work with new PCP - vaccines updated  2. Menopausal symptoms - non hormonal options for treatment discussed.  Information given.  Pt is going to do some research and will let me know if wants to start anything prescription.  She really does not want HRT and I am comfortable with this as well.  3. History of uterine fibroid - stable exam   4. History of dysmenorrhea  5. Right bundle branch block - has seen Dr. Gwenlyn Found.  No additional follow up recommended at that time.

## 2021-09-21 DIAGNOSIS — D2371 Other benign neoplasm of skin of right lower limb, including hip: Secondary | ICD-10-CM | POA: Diagnosis not present

## 2021-09-21 DIAGNOSIS — D2372 Other benign neoplasm of skin of left lower limb, including hip: Secondary | ICD-10-CM | POA: Diagnosis not present

## 2021-09-21 DIAGNOSIS — M79673 Pain in unspecified foot: Secondary | ICD-10-CM | POA: Diagnosis not present

## 2021-10-13 DIAGNOSIS — J0140 Acute pansinusitis, unspecified: Secondary | ICD-10-CM | POA: Diagnosis not present

## 2021-10-27 ENCOUNTER — Encounter: Payer: Self-pay | Admitting: Physician Assistant

## 2021-10-27 ENCOUNTER — Ambulatory Visit: Payer: BC Managed Care – PPO | Admitting: Physician Assistant

## 2021-10-27 DIAGNOSIS — Z1283 Encounter for screening for malignant neoplasm of skin: Secondary | ICD-10-CM | POA: Diagnosis not present

## 2021-10-27 DIAGNOSIS — Z86018 Personal history of other benign neoplasm: Secondary | ICD-10-CM

## 2021-10-27 NOTE — Progress Notes (Signed)
? ?  Follow-Up Visit ?  ?Subjective  ?Laurie Flores is a 56 y.o. female who presents for the following: Annual Exam (Here for annual skin exam. No concerns. No history of skin cancers. History of atypical moles. ). ? ? ?The following portions of the chart were reviewed this encounter and updated as appropriate:  Tobacco  Allergies  Meds  Problems  Med Hx  Surg Hx  Fam Hx   ?  ? ?Objective  ?Well appearing patient in no apparent distress; mood and affect are within normal limits. ? ?A full examination was performed including scalp, head, eyes, ears, nose, lips, neck, chest, axillae, abdomen, back, buttocks, bilateral upper extremities, bilateral lower extremities, hands, feet, fingers, toes, fingernails, and toenails. All findings within normal limits unless otherwise noted below. ? ?No atypical nevi No signs of non-mole skin cancer. All scars clear. ? ? ?Assessment & Plan  ?Screening for malignant neoplasm of skin ? ?Yearly skin exam. ? ? ? ?I, Mckinnley Cottier, PA-C, have reviewed all documentation's for this visit.  The documentation on 10/27/21 for the exam, diagnosis, procedures and orders are all accurate and complete. ?

## 2021-11-09 DIAGNOSIS — M791 Myalgia, unspecified site: Secondary | ICD-10-CM | POA: Diagnosis not present

## 2021-11-23 DIAGNOSIS — H1045 Other chronic allergic conjunctivitis: Secondary | ICD-10-CM | POA: Diagnosis not present

## 2021-12-08 DIAGNOSIS — N390 Urinary tract infection, site not specified: Secondary | ICD-10-CM | POA: Diagnosis not present

## 2021-12-14 ENCOUNTER — Telehealth (HOSPITAL_BASED_OUTPATIENT_CLINIC_OR_DEPARTMENT_OTHER): Payer: Self-pay | Admitting: *Deleted

## 2021-12-14 NOTE — Telephone Encounter (Signed)
Pt called stating that this past Wednesday she had to go to urgent care for fever and chills, was diagnosed with UTI and treated with macrobid. Pt states that she is feeling better. Still has one day left of antibiotic. Advised pt to finish course and let us know if symptoms have not completely resolved. Pt also complains of a pain that shoots down her leg that she states they have related to uterine fibroids in the past. She is asking if she should have an ultrasound to re-evaluate, to see if the pain could be coming from the fibroids. She reports some increased vaginal bleeding last month as well. Pt provided with appt.

## 2022-01-05 ENCOUNTER — Ambulatory Visit (INDEPENDENT_AMBULATORY_CARE_PROVIDER_SITE_OTHER): Payer: BC Managed Care – PPO

## 2022-01-05 ENCOUNTER — Ambulatory Visit (HOSPITAL_BASED_OUTPATIENT_CLINIC_OR_DEPARTMENT_OTHER): Payer: BC Managed Care – PPO | Admitting: Obstetrics & Gynecology

## 2022-01-05 ENCOUNTER — Other Ambulatory Visit (HOSPITAL_COMMUNITY)
Admission: RE | Admit: 2022-01-05 | Discharge: 2022-01-05 | Disposition: A | Discharge status: Home / Self Care | Payer: BC Managed Care – PPO | Source: Ambulatory Visit | Attending: Obstetrics & Gynecology | Admitting: Obstetrics & Gynecology

## 2022-01-05 ENCOUNTER — Encounter (HOSPITAL_BASED_OUTPATIENT_CLINIC_OR_DEPARTMENT_OTHER): Payer: Self-pay | Admitting: Obstetrics & Gynecology

## 2022-01-05 VITALS — BP 130/67 | HR 65 | Ht 64.5 in | Wt 139.0 lb

## 2022-01-05 DIAGNOSIS — Z124 Encounter for screening for malignant neoplasm of cervix: Secondary | ICD-10-CM | POA: Insufficient documentation

## 2022-01-05 DIAGNOSIS — Z86018 Personal history of other benign neoplasm: Secondary | ICD-10-CM

## 2022-01-05 DIAGNOSIS — R102 Pelvic and perineal pain unspecified side: Secondary | ICD-10-CM

## 2022-01-05 LAB — POCT URINALYSIS DIPSTICK
Appearance: NORMAL
Bilirubin, UA: NEGATIVE
Blood, UA: NEGATIVE
Glucose, UA: NEGATIVE
Ketones, UA: NEGATIVE
Leukocytes, UA: NEGATIVE
Nitrite, UA: NEGATIVE
Protein, UA: NEGATIVE
Spec Grav, UA: 1.01 (ref 1.010–1.025)
Urobilinogen, UA: 0.2 E.U./dL
pH, UA: 6 (ref 5.0–8.0)

## 2022-01-05 MED ORDER — CYCLOBENZAPRINE HCL 10 MG PO TABS: ORAL_TABLET | ORAL | 0 refills | Status: DC

## 2022-01-05 NOTE — Progress Notes (Signed)
GYNECOLOGY  VISIT  CC:   irregular bleeding and pelvic pain  HPI: 56 y.o. G22P0011 Married White or Caucasian female here for complaint of irregular bleeding and lower pelvic pain.  Reports she skipped a lot of cycles this past year, several months at at time.  However, in April and May she started cycles back.  Since that time, she's started having pelvic pain again.  Last ultrasound was 11/2019 and uterus was about 11.5 x 7.5 x 5.5cm.  Reviewed images today when pt was in office.  Fibroids were present.  Pt has had this pelvic pain for several years.  We have discussed this in the past.  Pt reports pain worsened and she had significant RLQ pain with hematuria.  Went to Urgent Care and was treated for UTI.  Was treated with macrobid.   She has been experiencing leg cramps as well.  She's had lab work with PCP that showed low calcium and low Vit D.  She started taking calcium but had horrible constipation.  Is trying to increase her dietary intake of calcium.   Past Medical History:  Diagnosis Date   Abnormal Pap smear 1/09   AGUS pap   Atypical nevus 01/28/2009   Right Upper Back -Moderate   Atypical nevus 05/04/2010   Right Low Paraspinal-Minimal, Left Mid Paraspinal-Mild to Moderate and Right Upper Paraspinal-Minimal   Atypical nevus 11/30/2011   Right Upper Paraspinal-Mild to Moderate   Atypical nevus 12/03/2013   Right Upper Back-Mild, and Right Lower Scapula-Moderate(widershave)   Atypical nevus 06/07/2016   Right Post Shoulder-Mild   GERD (gastroesophageal reflux disease)    H/O: C-section    IBS (irritable bowel syndrome)    ?    MEDS:   Current Outpatient Medications on File Prior to Visit  Medication Sig Dispense Refill   Azelastine HCl (ASTELIN NA) Place into the nose daily.      Cholecalciferol (VITAMIN D PO) Take 2,000 Units by mouth daily.      famotidine (PEPCID) 20 MG tablet Take 20 mg by mouth at bedtime.     fexofenadine (ALLEGRA) 60 MG tablet Take 60 mg by mouth  daily.     fluticasone (FLONASE) 50 MCG/ACT nasal spray Place 2 sprays into the nose daily.     Magnesium 200 MG TABS Take 200 mg by mouth daily.     omeprazole (PRILOSEC) 20 MG capsule Take 20 mg by mouth daily.     PROAIR HFA 108 (90 Base) MCG/ACT inhaler      Probiotic Product (SOLUBLE FIBER/PROBIOTICS PO) Take by mouth daily.     QVAR REDIHALER 80 MCG/ACT inhaler 1 puff daily.     No current facility-administered medications on file prior to visit.    ALLERGIES: Amoxicillin-pot clavulanate, Ciprofloxacin, Shellfish allergy, and Sulfa antibiotics  SH:  married, non smoker  ROS  PHYSICAL EXAMINATION:    BP 130/67 (BP Location: Left Arm, Patient Position: Sitting, Cuff Size: Large)   Pulse 65   Ht 5' 4.5" (1.638 m) Comment: reported  Wt 139 lb (63 kg)   BMI 23.49 kg/m     General appearance: alert, cooperative and appears stated age Abdomen: soft, non-tender; bowel sounds normal; no masses,  no organomegaly Lymph:  no inguinal LAD noted  Pelvic: External genitalia:  no lesions              Urethra:  normal appearing urethra with no masses, tenderness or lesions  Bartholins and Skenes: normal                 Vagina: normal appearing vagina with normal color and discharge, no lesions              Cervix: no lesions   Pap obtained as pt is scheduled next month for this.              Bimanual Exam:  Uterus:  enlarged, about 8 weeks size but does feel smaller              Adnexa: no mass, fullness, tenderness              Tenderness to pelvic floor on left > right  Chaperone, Octaviano Batty, CMA, was present for exam.  Assessment/Plan: 1. Pelvic pain - Ambulatory referral to Physical Therapy - cyclobenzaprine (FLEXERIL) 10 MG tablet; Take 1 tablet at night for pelvic floor pain.  If this helps, can increase to TID prn for pelvic floor pain  Dispense: 30 tablet; Refill: 0 - US PELVIS TRANSVAGINAL NON-OB (TV ONLY); Future - POCT urinalysis dipstick  2. History of  uterine fibroid - pt is going to return for this later today. - US PELVIS TRANSVAGINAL NON-OB (TV ONLY); Future  3. Cervical cancer screening - Cytology - PAP( [place])   Addendum:  pt did return for ultrasound.  Fibroids still present but smaller than compared to 2021 results.  Ovaries normal.  No free fluid.  Endometrium thin.  No additional recommendations to above plan made today.  Total time with pt today:  37 minutes

## 2022-01-07 LAB — CYTOLOGY - PAP
Adequacy: ABSENT
Comment: NEGATIVE
Diagnosis: NEGATIVE
High risk HPV: NEGATIVE

## 2022-01-28 NOTE — Therapy (Signed)
OUTPATIENT PHYSICAL THERAPY FEMALE PELVIC EVALUATION   Patient Name: Laurie Flores MRN: [id number] DOB:29-Jul-1965, 56 y.o., female Today's Date: 01/31/2022   PT End of Session - 01/31/22 0838     Visit Number 1    Date for PT Re-Evaluation 04/25/22    Authorization Type BC[employer]    PT Start Time 0800    PT Stop Time 0840    PT Time Calculation (min) 40 min    Activity Tolerance Patient tolerated treatment well    Behavior During Therapy [facility] for tasks assessed/performed             Past Medical History:  Diagnosis Date   Abnormal Pap smear 1/09   AGUS pap   Atypical nevus 01/28/2009   Right Upper Back -Moderate   Atypical nevus 05/04/2010   Right Low Paraspinal-Minimal, Left Mid Paraspinal-Mild to Moderate and Right Upper Paraspinal-Minimal   Atypical nevus 11/30/2011   Right Upper Paraspinal-Mild to Moderate   Atypical nevus 12/03/2013   Right Upper Back-Mild, and Right Lower Scapula-Moderate(widershave)   Atypical nevus 06/07/2016   Right Post Shoulder-Mild   GERD (gastroesophageal reflux disease)    H/O: C-section    IBS (irritable bowel syndrome)    ?   Past Surgical History:  Procedure Laterality Date   CESAREAN SECTION     CYSTOSCOPY  4/09   in office, secondary to microscopic hematuria   DILATION AND CURETTAGE OF UTERUS     NASAL SEPTUM SURGERY     TONSILLECTOMY     Patient Active Problem List   Diagnosis Date Noted   Right bundle branch block 03/15/2019   Family history of heart disease 03/15/2019   Vitamin D deficiency 10/16/2017   Porokeratosis 02/14/2017   Plantar fasciitis 02/14/2017   Intramural and subserous leiomyoma of uterus 10/29/2016   Anal fissure 10/29/2016   ANXIETY 09/06/2007   ALLERGIC RHINITIS, SEASONAL 09/06/2007   ASTHMA 09/06/2007   CONSTIPATION, CHRONIC 09/06/2007   ARTHRALGIA 09/06/2007   EPIGASTRIC PAIN 09/06/2007   Asthma 09/06/2007   IRRITABLE BOWEL SYNDROME 04/18/2007   GERD 09/19/2004   External hemorrhoids  11/19/2002    PCP: Doyle Askew, PA-C  REFERRING PROVIDER: Megan Salon, MD  REFERRING DIAG: R10.2 (ICD-10-CM) - Pelvic pain  THERAPY DIAG:  Cramp and spasm  Pelvic pain  Other low back pain  Rationale for Evaluation and Treatment Rehabilitation  ONSET DATE: chronic  SUBJECTIVE:                                                                                                                                                                                           SUBJECTIVE  STATEMENT: Patient has had uterine fibroids forever. She does not want the fibroids out.  Fluid intake: Yes: water  PAIN:  Are you having pain? Yes NPRS scale: 2/10 Pain location:  low abdominal that circles to the lumbar, fibroid pain in right lower quadrant  Pain type: aching Pain description: constant   Aggravating factors: bowel movement and urination Relieving factors: after the bowel movement and urination  PAIN:  Are you having pain? Yes: NPRS scale: 4/10 Pain location: low back Pain description: ache, intermittent Aggravating factors: random and prior to urination Relieving factors: after urination   PRECAUTIONS: None  WEIGHT BEARING RESTRICTIONS No  FALLS:  Has patient fallen in last 6 months? 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  PLOF: Independent  PATIENT GOALS relieve pain, increase pelvic floor strength  PERTINENT HISTORY:  C-section; IBS   BOWEL MOVEMENT Pain with bowel movement: Yes, prior to bowel movement Type of bowel movement:Type (Bristol Stool Scale) type 4, Frequency 2-4 times per week, and Strain Yes, sometimes Fully empty rectum: Yes: sometimes she does not Leakage: No Pads: No Fiber supplement: Yes: metamucil  URINATION Pain with urination: No Fully empty bladder: No, not always and tries to sit and relax to fully empty Stream: Strong Urgency: Yes:  starts to feel increased pressure and pain then goes away when she goes to the bathroom.  Frequency: 2-3 hours Leakage:  no Pads: No  INTERCOURSE Pain with intercourse:  no  PREGNANCY C-section deliveries 1 Currently pregnant No    OBJECTIVE:   DIAGNOSTIC FINDINGS:  none  PATIENT SURVEYS:  none   COGNITION:  Overall cognitive status: Within functional limits for tasks assessed                   POSTURE: No Significant postural limitations   PELVIC ALIGNMENT:  LUMBARAROM/PROM full lumbar ROM   LOWER EXTREMITY ROM: Full bilateral hip ROM   (Blank rows = not tested)  LOWER EXTREMITY MMT:  MMT Right eval Left eval  Hip abduction 4/5 4/5    PALPATION:   General  tenderness located on the right lower quadrant                External Perineal Exam good coloring of tissue                             Internal Pelvic Floor tenderness located in bilateral obturator internist and levator ani  Patient confirms identification and approves PT to assess internal pelvic floor and treatment Yes  PELVIC MMT:   MMT eval  Vaginal 4/5  (Blank rows = not tested)        TONE: increased  PROLAPSE: none  TODAY'S TREATMENT  EVAL Date: 01/31/2022 HEP established-see below   PATIENT EDUCATION:  Education details: Access Code: [email or web address] ,educated patient on vaginal wand and how to massage her pelvic floor muscles Person educated: Patient Education method: Explanation, Demonstration, Tactile cues, Verbal cues, and Handouts Education comprehension: verbalized understanding, returned demonstration, verbal cues required, tactile cues required, and needs further education   HOME EXERCISE PROGRAM: 01/31/2022 Access Code: [email or web address] URL: [email or web address] Date: 01/31/2022 Prepared by: Earlie Counts  Exercises - Supine Double Knee to Chest  - 1 x daily - 7 x weekly - 1 sets - 2 reps - 30 seconds hold - Supine Piriformis Stretch with Leg Straight  - 1  x  daily - 7 x weekly - 1 sets - 2 reps - 30 sec hold - Child's Pose Stretch  - 1 x daily - 7 x weekly - 1 sets - 1 reps - 30 sec hold - Cat Cow  - 1 x daily - 7 x weekly - 1 sets - 10 reps - Seated Piriformis Stretch with Trunk Bend  - 1 x daily - 7 x weekly - 1 sets - 2 reps - 30 sec hold - Seated Hamstring Stretch  - 1 x daily - 7 x weekly - 1 sets - 2 reps - 30 sec hold  ASSESSMENT:  CLINICAL IMPRESSION: Patient is a 56 y.o. female who was seen today for physical therapy evaluation and treatment for pelvic pain. Patient reports her pelvic pain is 2-3/10 that starts in the lower abdominal area and wraps to her low back. Her pain is worse prior to bowel movement and urination. After she urinates or have a bowel movement the pain subsides. Lumbar ROM is full. She has palpable tenderness located in the right lower quadrant, along bilateral levator ani and obturator internist. Pelvic floor strength is 4/5. Patient will benefit from skilled therapy to reduce pain to improve quality of life.    OBJECTIVE IMPAIRMENTS decreased activity tolerance, increased fascial restrictions, increased muscle spasms, and pain.   ACTIVITY LIMITATIONS toileting  PARTICIPATION LIMITATIONS:  none  PERSONAL FACTORS C-section; IBS are also affecting patient's functional outcome.   REHAB POTENTIAL: Excellent  CLINICAL DECISION MAKING: Stable/uncomplicated  EVALUATION COMPLEXITY: Low   GOALS: Goals reviewed with patient? Yes  SHORT TERM GOALS: Target date: 02/28/2022  Patient understands how to massage the pelvic floor muscles to reduce trigger points.  Baseline: Goal status: INITIAL  2.  Patient independent with hip and back stretches to reduce her pain.  Baseline:  Goal status: INITIAL    LONG TERM GOALS: Target date: 04/25/2022   Patient independent with advanced HEP including management of pain using the vaginal wand Baseline:  Goal status: INITIAL  2.  Patient reports her pain prior to urination  is </= 1/10 due to reduction of trigger points.  Baseline:  Goal status: INITIAL  3.  Patient reports her pain prior to bowel movements </= 1/10 due to reduction of trigger points.  Baseline:  Goal status: INITIAL   PLAN: PT FREQUENCY: 1x/week  PT DURATION: 12 weeks  PLANNED INTERVENTIONS: Therapeutic exercises, Therapeutic activity, Neuromuscular re-education, Balance training, Patient/Family education, Self Care, Joint mobilization, Dry Needling, Electrical stimulation, Cryotherapy, Moist heat, Taping, and Manual therapy  PLAN FOR NEXT SESSION: work on pelvic floor muscles, see if she got the wand, diaphragmatic breathing to relax the pelvic floor,    Earlie Counts, PT 01/31/22 8:57 AM

## 2022-01-31 ENCOUNTER — Encounter: Payer: Self-pay | Admitting: Physical Therapy

## 2022-01-31 ENCOUNTER — Other Ambulatory Visit: Payer: Self-pay

## 2022-01-31 ENCOUNTER — Ambulatory Visit: Payer: BC Managed Care – PPO | Attending: Obstetrics & Gynecology | Admitting: Physical Therapy

## 2022-01-31 DIAGNOSIS — R102 Pelvic and perineal pain: Secondary | ICD-10-CM | POA: Diagnosis not present

## 2022-01-31 DIAGNOSIS — R252 Cramp and spasm: Secondary | ICD-10-CM

## 2022-01-31 DIAGNOSIS — M5459 Other low back pain: Secondary | ICD-10-CM

## 2022-02-11 ENCOUNTER — Ambulatory Visit (HOSPITAL_BASED_OUTPATIENT_CLINIC_OR_DEPARTMENT_OTHER): Payer: BC Managed Care – PPO | Admitting: Obstetrics & Gynecology

## 2022-02-18 ENCOUNTER — Encounter: Payer: Self-pay | Admitting: Physical Therapy

## 2022-02-18 ENCOUNTER — Ambulatory Visit: Payer: BC Managed Care – PPO | Attending: Obstetrics & Gynecology | Admitting: Physical Therapy

## 2022-02-18 DIAGNOSIS — R102 Pelvic and perineal pain: Secondary | ICD-10-CM | POA: Insufficient documentation

## 2022-02-18 DIAGNOSIS — R252 Cramp and spasm: Secondary | ICD-10-CM | POA: Diagnosis not present

## 2022-02-18 DIAGNOSIS — M5459 Other low back pain: Secondary | ICD-10-CM | POA: Insufficient documentation

## 2022-02-18 NOTE — Therapy (Signed)
OUTPATIENT PHYSICAL THERAPY TREATMENT NOTE   Patient Name: Laurie Flores MRN: [id number] DOB:Sep 03, 1965, 56 y.o., female Today's Date: 02/18/2022  PCP: Doyle Askew, PA-C REFERRING PROVIDER: Megan Salon, MD  END OF SESSION:   PT End of Session - 02/18/22 0851     Visit Number 2    Date for PT Re-Evaluation 04/25/22    Authorization Type [employer]    PT Start Time 0845    PT Stop Time 0925    PT Time Calculation (min) 40 min    Activity Tolerance Patient tolerated treatment well    Behavior During Therapy [facility] for tasks assessed/performed             Past Medical History:  Diagnosis Date   Abnormal Pap smear 1/09   AGUS pap   Atypical nevus 01/28/2009   Right Upper Back -Moderate   Atypical nevus 05/04/2010   Right Low Paraspinal-Minimal, Left Mid Paraspinal-Mild to Moderate and Right Upper Paraspinal-Minimal   Atypical nevus 11/30/2011   Right Upper Paraspinal-Mild to Moderate   Atypical nevus 12/03/2013   Right Upper Back-Mild, and Right Lower Scapula-Moderate(widershave)   Atypical nevus 06/07/2016   Right Post Shoulder-Mild   GERD (gastroesophageal reflux disease)    H/O: C-section    IBS (irritable bowel syndrome)    ?   Past Surgical History:  Procedure Laterality Date   CESAREAN SECTION     CYSTOSCOPY  4/09   in office, secondary to microscopic hematuria   DILATION AND CURETTAGE OF UTERUS     NASAL SEPTUM SURGERY     TONSILLECTOMY     Patient Active Problem List   Diagnosis Date Noted   Right bundle branch block 03/15/2019   Family history of heart disease 03/15/2019   Vitamin D deficiency 10/16/2017   Porokeratosis 02/14/2017   Plantar fasciitis 02/14/2017   Intramural and subserous leiomyoma of uterus 10/29/2016   Anal fissure 10/29/2016   ANXIETY 09/06/2007   ALLERGIC RHINITIS, SEASONAL 09/06/2007   ASTHMA 09/06/2007   CONSTIPATION, CHRONIC 09/06/2007   ARTHRALGIA 09/06/2007   EPIGASTRIC PAIN 09/06/2007   Asthma 09/06/2007    IRRITABLE BOWEL SYNDROME 04/18/2007   GERD 09/19/2004   External hemorrhoids 11/19/2002   REFERRING DIAG: R10.2 (ICD-10-CM) - Pelvic pain   THERAPY DIAG:  Cramp and spasm   Pelvic pain   Other low back pain   Rationale for Evaluation and Treatment Rehabilitation   ONSET DATE: chronic   SUBJECTIVE:  SUBJECTIVE STATEMENT: I have not gotten the wand and doing my own pelvic floor muscles.   PAIN:  Are you having pain? Yes NPRS scale: 2/10 Pain location:  low abdominal that circles to the lumbar, fibroid pain in right lower quadrant   Pain type: aching Pain description: constant    Aggravating factors: bowel movement and urination Relieving factors: after the bowel movement and urination   PAIN:  Are you having pain? Yes: NPRS scale: 4/10 Pain location: low back Pain description: ache, intermittent Aggravating factors: random and prior to urination Relieving factors: after urination     PRECAUTIONS: None   WEIGHT BEARING RESTRICTIONS No   FALLS:  Has patient fallen in last 6 months? 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   PLOF: Independent   PATIENT GOALS relieve pain, increase pelvic floor strength   PERTINENT HISTORY:  C-section; IBS     BOWEL MOVEMENT Pain with bowel movement: Yes, prior to bowel movement Type of bowel movement:Type (Bristol Stool Scale) type 4, Frequency 2-4 times per week, and Strain Yes, sometimes Fully empty rectum: Yes: sometimes she does not Leakage: No Pads: No Fiber supplement: Yes: metamucil   URINATION Pain with urination: No Fully empty bladder: No, not always and tries to sit and relax to fully empty Stream: Strong Urgency: Yes: starts to feel  increased pressure and pain then goes away when she goes to the bathroom.  Frequency: 2-3 hours Leakage:  no Pads: No   INTERCOURSE Pain with intercourse:  no   PREGNANCY C-section deliveries 1 Currently pregnant No       OBJECTIVE:    DIAGNOSTIC FINDINGS:  none   PATIENT SURVEYS:  none     COGNITION:            Overall cognitive status: Within functional limits for tasks assessed                                         POSTURE: No Significant postural limitations               PELVIC ALIGNMENT:   LUMBARAROM/PROM full lumbar ROM     LOWER EXTREMITY ROM: Full bilateral hip ROM    (Blank rows = not tested)   LOWER EXTREMITY MMT:   MMT Right eval Left eval  Hip abduction 4/5 4/5     PALPATION:   General  tenderness located on the right lower quadrant                 External Perineal Exam good coloring of tissue                             Internal Pelvic Floor tenderness located in bilateral obturator internist and levator ani   Patient confirms identification and approves PT to assess internal pelvic floor and treatment Yes   PELVIC MMT:   MMT eval  Vaginal 4/5  (Blank rows = not tested)         TONE: increased   PROLAPSE: none   TODAY'S TREATMENT  02/18/2022 Manual: Soft tissue mobilization:to bilateral diaphragm Myofascial release:fascial release of the abdominal tissue in all directions and areas to reduce the tension Fascial release of the lower abdominal moving from pubic bone  and umbilicus Tissue rolling of the abdomen  Manual work to the umbilicus with hip rotation Neuromuscular re-education: Down training:laying on floor with butterfly stretch performing diaphragmatic breathing to elongate the pelvic floor muscles for 5 minutes    EVAL Date: 01/31/2022 HEP established-see below    PATIENT EDUCATION:  Education details: Access Code: [email or web address] ,educated patient on vaginal wand and how to massage her pelvic floor muscles Person  educated: Patient Education method: Explanation, Demonstration, Tactile cues, Verbal cues, and Handouts Education comprehension: verbalized understanding, returned demonstration, verbal cues required, tactile cues required, and needs further education     HOME EXERCISE PROGRAM: 01/31/2022 Access Code: [email or web address] URL: [email or web address] Date: 01/31/2022 Prepared by: Earlie Counts   Exercises - Supine Double Knee to Chest  - 1 x daily - 7 x weekly - 1 sets - 2 reps - 30 seconds hold - Supine Piriformis Stretch with Leg Straight  - 1 x daily - 7 x weekly - 1 sets - 2 reps - 30 sec hold - Child's Pose Stretch  - 1 x daily - 7 x weekly - 1 sets - 1 reps - 30 sec hold - Cat Cow  - 1 x daily - 7 x weekly - 1 sets - 10 reps - Seated Piriformis Stretch with Trunk Bend  - 1 x daily - 7 x weekly - 1 sets - 2 reps - 30 sec hold - Seated Hamstring Stretch  - 1 x daily - 7 x weekly - 1 sets - 2 reps - 30 sec hold   ASSESSMENT:   CLINICAL IMPRESSION: Patient is a 56 y.o. female who was seen today for physical therapy  treatment for pelvic pain. Patient is able to perform diaphragmatic breathing and feel the pelvic floor expand. Patient had less abdominal pain after manual work. Patient had less thickness of the lower abdomen.  Patient will benefit from skilled therapy to reduce pain to improve quality of life.      OBJECTIVE IMPAIRMENTS decreased activity tolerance, increased fascial restrictions, increased muscle spasms, and pain.    ACTIVITY LIMITATIONS toileting   PARTICIPATION LIMITATIONS:  none   PERSONAL FACTORS C-section; IBS are also affecting patient's functional outcome.    REHAB POTENTIAL: Excellent   CLINICAL DECISION MAKING: Stable/uncomplicated   EVALUATION COMPLEXITY: Low     GOALS: Goals reviewed with patient? Yes   SHORT TERM GOALS: Target date: 02/28/2022   Patient understands how to massage the pelvic floor muscles to reduce trigger points.   Baseline: Goal status: met 02/18/2022   2.  Patient independent with hip and back stretches to reduce her pain.  Baseline:  Goal status: INITIAL       LONG TERM GOALS: Target date: 04/25/2022    Patient independent with advanced HEP including management of pain using the vaginal wand Baseline:  Goal status: INITIAL   2.  Patient reports her pain prior to urination is </= 1/10 due to reduction of trigger points.  Baseline:  Goal status: INITIAL   3.  Patient reports her pain prior to bowel movements </= 1/10 due to reduction of trigger points.  Baseline:  Goal status: INITIAL     PLAN: PT FREQUENCY: 1x/week   PT DURATION: 12 weeks   PLANNED INTERVENTIONS: Therapeutic exercises, Therapeutic activity, Neuromuscular re-education, Balance training, Patient/Family education, Self Care, Joint mobilization, Dry Needling, Electrical stimulation, Cryotherapy, Moist heat, Taping, and Manual therapy   PLAN FOR NEXT SESSION: work on pelvic floor muscles, around the umbilicus  Earlie Counts, PT 02/18/22 9:27  AM

## 2022-02-28 ENCOUNTER — Encounter: Payer: Self-pay | Admitting: Physical Therapy

## 2022-02-28 ENCOUNTER — Ambulatory Visit: Payer: BC Managed Care – PPO | Admitting: Physical Therapy

## 2022-02-28 DIAGNOSIS — R102 Pelvic and perineal pain: Secondary | ICD-10-CM | POA: Diagnosis not present

## 2022-02-28 DIAGNOSIS — M5459 Other low back pain: Secondary | ICD-10-CM | POA: Diagnosis not present

## 2022-02-28 DIAGNOSIS — R252 Cramp and spasm: Secondary | ICD-10-CM

## 2022-02-28 NOTE — Therapy (Signed)
OUTPATIENT PHYSICAL THERAPY TREATMENT NOTE   Patient Name: Laurie Flores MRN: [id number] DOB:[birth year], 56 y.o., female Today's Date: 02/28/2022  PCP: Doyle Askew, PA-C REFERRING PROVIDER: Megan Salon, MD  END OF SESSION:   PT End of Session - 02/28/22 1532     Visit Number 3    Date for PT Re-Evaluation 04/25/22    Authorization Type BCBS    PT Start Time 1530    PT Stop Time 1610    PT Time Calculation (min) 40 min    Activity Tolerance Patient tolerated treatment well    Behavior During Therapy [facility] for tasks assessed/performed             Past Medical History:  Diagnosis Date   Abnormal Pap smear 1/09   AGUS pap   Atypical nevus 01/28/2009   Right Upper Back -Moderate   Atypical nevus 05/04/2010   Right Low Paraspinal-Minimal, Left Mid Paraspinal-Mild to Moderate and Right Upper Paraspinal-Minimal   Atypical nevus 11/30/2011   Right Upper Paraspinal-Mild to Moderate   Atypical nevus 12/03/2013   Right Upper Back-Mild, and Right Lower Scapula-Moderate(widershave)   Atypical nevus 06/07/2016   Right Post Shoulder-Mild   GERD (gastroesophageal reflux disease)    H/O: C-section    IBS (irritable bowel syndrome)    ?   Past Surgical History:  Procedure Laterality Date   CESAREAN SECTION     CYSTOSCOPY  4/09   in office, secondary to microscopic hematuria   DILATION AND CURETTAGE OF UTERUS     NASAL SEPTUM SURGERY     TONSILLECTOMY     Patient Active Problem List   Diagnosis Date Noted   Right bundle branch block 03/15/2019   Family history of heart disease 03/15/2019   Vitamin D deficiency 10/16/2017   Porokeratosis 02/14/2017   Plantar fasciitis 02/14/2017   Intramural and subserous leiomyoma of uterus 10/29/2016   Anal fissure 10/29/2016   ANXIETY 09/06/2007   ALLERGIC RHINITIS, SEASONAL 09/06/2007   ASTHMA 09/06/2007   CONSTIPATION, CHRONIC 09/06/2007   ARTHRALGIA 09/06/2007   EPIGASTRIC PAIN 09/06/2007   Asthma 09/06/2007    IRRITABLE BOWEL SYNDROME 04/18/2007   GERD 09/19/2004   External hemorrhoids 11/19/2002   REFERRING DIAG: R10.2 (ICD-10-CM) - Pelvic pain   THERAPY DIAG:  Cramp and spasm   Pelvic pain   Other low back pain   Rationale for Evaluation and Treatment Rehabilitation   ONSET DATE: chronic   SUBJECTIVE:  SUBJECTIVE STATEMENT: I felt good after therapy. I am doing my program at home. I am thinking about getting the wand.  PAIN:  Are you having pain? Yes NPRS scale: 1/10, nagging fullness Pain location:  low abdominal that circles to the lumbar, fibroid pain in right lower quadrant   Pain type: aching Pain description: constant    Aggravating factors: bowel movement and urination Relieving factors: after the bowel movement and urination   PAIN:  Are you having pain? Yes: NPRS scale: 0/10, tightness Pain location: low back Pain description: ache, intermittent Aggravating factors: random and prior to urination Relieving factors: after urination     PRECAUTIONS: None   WEIGHT BEARING RESTRICTIONS No   FALLS:  Has patient fallen in last 6 months? 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   PLOF: Independent   PATIENT GOALS relieve pain, increase pelvic floor strength   PERTINENT HISTORY:  C-section; IBS     BOWEL MOVEMENT Pain with bowel movement: Yes, prior to bowel movement Type of bowel movement:Type (Bristol Stool Scale) type 4, Frequency 2-4 times per week, and Strain Yes, sometimes Fully empty rectum: Yes: sometimes she does not Leakage: No Pads: No Fiber supplement: Yes: metamucil   URINATION Pain with urination: No Fully empty bladder: No, not always and tries to sit and relax to  fully empty Stream: Strong Urgency: Yes: starts to feel increased pressure and pain then goes away when she goes to the bathroom.  Frequency: 2-3 hours Leakage:  no Pads: No   INTERCOURSE Pain with intercourse:  no   PREGNANCY C-section deliveries 1 Currently pregnant No       OBJECTIVE:    DIAGNOSTIC FINDINGS:  none   PATIENT SURVEYS:  none     COGNITION:            Overall cognitive status: Within functional limits for tasks assessed                                         POSTURE: No Significant postural limitations               PELVIC ALIGNMENT:   LUMBARAROM/PROM full lumbar ROM     LOWER EXTREMITY ROM: Full bilateral hip ROM    (Blank rows = not tested)   LOWER EXTREMITY MMT:   MMT Right eval Left eval  Hip abduction 4/5 4/5     PALPATION:   General  tenderness located on the right lower quadrant                 External Perineal Exam good coloring of tissue                             Internal Pelvic Floor tenderness located in bilateral obturator internist and levator ani   Patient confirms identification and approves PT to assess internal pelvic floor and treatment Yes   PELVIC MMT:   MMT eval  Vaginal 4/5  (Blank rows = not tested)         TONE: increased   PROLAPSE: none   TODAY'S TREATMENT  02/28/2022 Manual: Internal pelvic floor techniques:No emotional/communication barriers or cognitive limitation. Patient is motivated to learn. Patient understands and agrees with treatment goals and plan.  PT explains patient will be examined in standing, sitting, and lying down to see how their muscles and joints work. When they are ready, they will be asked to remove their underwear so PT can examine their perineum. The patient is also given the option of providing their own chaperone as one is not provided in our facility. The patient also has the right and is explained the right to defer or refuse any part of the evaluation or treatment  including the internal exam. With the patient's consent, PT will use one gloved finger to gently assess the muscles of the pelvic floor, seeing how well it contracts and relaxes and if there is muscle symmetry. After, the patient will get dressed and PT and patient will discuss exam findings and plan of care. PT and patient discuss plan of care, schedule, attendance policy and HEP activities.  Going into the vaginal canal working on the levator ani, perineal body, and right obturator internist with movement of right hip and contract relax to reduce the trigger points.  Exercises: Stretches/mobility:single knee to chest holding 15 sec then do the other.  Double knee to chest with knees apart holding for 30 seconds Hip piriformis stretch holding 30 sec bil.  Prone press ups 10x     02/18/2022 Manual: Soft tissue mobilization:to bilateral diaphragm Myofascial release:fascial release of the abdominal tissue in all directions and areas to reduce the tension Fascial release of the lower abdominal moving from pubic bone and umbilicus Tissue rolling of the abdomen  Manual work to the umbilicus with hip rotation Neuromuscular re-education: Down training:laying on floor with butterfly stretch performing diaphragmatic breathing to elongate the pelvic floor muscles for 5 minutes    EVAL Date: 01/31/2022 HEP established-see below    PATIENT EDUCATION:  Education details: Access Code: [email or web address] ,educated patient on vaginal wand and how to massage her pelvic floor muscles Person educated: Patient Education method: Explanation, Demonstration, Tactile cues, Verbal cues, and Handouts Education comprehension: verbalized understanding, returned demonstration, verbal cues required, tactile cues required, and needs further education     HOME EXERCISE PROGRAM: 01/31/2022 Access Code: [email or web address] URL: [email or web address] Date: 01/31/2022 Prepared by: Earlie Counts   Exercises - Supine Double Knee  to Chest  - 1 x daily - 7 x weekly - 1 sets - 2 reps - 30 seconds hold - Supine Piriformis Stretch with Leg Straight  - 1 x daily - 7 x weekly - 1 sets - 2 reps - 30 sec hold - Child's Pose Stretch  - 1 x daily - 7 x weekly - 1 sets - 1 reps - 30 sec hold - Cat Cow  - 1 x daily - 7 x weekly - 1 sets - 10 reps - Seated Piriformis Stretch with Trunk Bend  - 1 x daily - 7 x weekly - 1 sets - 2 reps - 30 sec hold - Seated Hamstring Stretch  - 1 x daily - 7 x weekly - 1 sets - 2 reps - 30 sec hold   ASSESSMENT:   CLINICAL IMPRESSION: Patient is a 56 y.o. female who was seen today for physical therapy  treatment for pelvic pain. Patient had tightness in the perineal body, right levator ani and obturator internist. Patient had less pain after the manual work. She is considering getting the vaginal wand to work on the muscles.  Patient will benefit from skilled therapy to reduce pain to improve quality of life.      OBJECTIVE IMPAIRMENTS decreased activity tolerance, increased fascial  restrictions, increased muscle spasms, and pain.    ACTIVITY LIMITATIONS toileting   PARTICIPATION LIMITATIONS:  none   PERSONAL FACTORS C-section; IBS are also affecting patient's functional outcome.    REHAB POTENTIAL: Excellent   CLINICAL DECISION MAKING: Stable/uncomplicated   EVALUATION COMPLEXITY: Low     GOALS: Goals reviewed with patient? Yes   SHORT TERM GOALS: Target date: 02/28/2022   Patient understands how to massage the pelvic floor muscles to reduce trigger points.  Baseline: Goal status: met 02/18/2022   2.  Patient independent with hip and back stretches to reduce her pain.  Baseline:  Goal status: met 02/28/2022       LONG TERM GOALS: Target date: 04/25/2022    Patient independent with advanced HEP including management of pain using the vaginal wand Baseline:  Goal status: INITIAL   2.  Patient reports her pain prior to urination is </= 1/10 due to reduction of trigger points.   Baseline:  Goal status: INITIAL   3.  Patient reports her pain prior to bowel movements </= 1/10 due to reduction of trigger points.  Baseline:  Goal status: INITIAL     PLAN: PT FREQUENCY: 1x/week   PT DURATION: 12 weeks   PLANNED INTERVENTIONS: Therapeutic exercises, Therapeutic activity, Neuromuscular re-education, Balance training, Patient/Family education, Self Care, Joint mobilization, Dry Needling, Electrical stimulation, Cryotherapy, Moist heat, Taping, and Manual therapy   PLAN FOR NEXT SESSION: work on pelvic floor muscles, around the umbilicus, ask about pain with urination and bowel movements     Earlie Counts, PT 02/28/22 4:10 PM

## 2022-03-12 DIAGNOSIS — R350 Frequency of micturition: Secondary | ICD-10-CM | POA: Diagnosis not present

## 2022-03-14 ENCOUNTER — Ambulatory Visit: Payer: BC Managed Care – PPO | Admitting: Physical Therapy

## 2022-03-28 ENCOUNTER — Ambulatory Visit (INDEPENDENT_AMBULATORY_CARE_PROVIDER_SITE_OTHER): Payer: BC Managed Care – PPO

## 2022-03-28 DIAGNOSIS — Z87440 Personal history of urinary (tract) infections: Secondary | ICD-10-CM | POA: Diagnosis not present

## 2022-03-28 LAB — POCT URINALYSIS DIPSTICK
Bilirubin, UA: NEGATIVE
Blood, UA: NEGATIVE
Glucose, UA: NEGATIVE
Ketones, UA: NEGATIVE
Leukocytes, UA: NEGATIVE
Nitrite, UA: NEGATIVE
Protein, UA: NEGATIVE
Spec Grav, UA: <=1.005 — AB
Urobilinogen, UA: 0.2 U/dL
pH, UA: 6

## 2022-03-28 NOTE — Progress Notes (Unsigned)
Patient came in today to give a urine sample for repeat urine. Urine was dipped for urinalysis and sent off for a urine culture.tbw

## 2022-03-29 LAB — URINE CULTURE

## 2022-03-31 ENCOUNTER — Telehealth: Payer: Self-pay | Admitting: Physical Therapy

## 2022-03-31 NOTE — Telephone Encounter (Signed)
Spoke to patient on the phone. She had to cancel her appt. Tomorrow due to have a UTI and increased pelvic pain. She will keep her 9/25 appt.  Earlie Counts, PT '@9'$ /14/2023@ 10:51 AM

## 2022-04-01 ENCOUNTER — Ambulatory Visit: Payer: BC Managed Care – PPO | Admitting: Physical Therapy

## 2022-04-11 ENCOUNTER — Ambulatory Visit: Payer: BC Managed Care – PPO | Attending: Obstetrics & Gynecology | Admitting: Physical Therapy

## 2022-04-11 ENCOUNTER — Encounter: Payer: Self-pay | Admitting: Physical Therapy

## 2022-04-11 DIAGNOSIS — R252 Cramp and spasm: Secondary | ICD-10-CM | POA: Diagnosis not present

## 2022-04-11 DIAGNOSIS — R102 Pelvic and perineal pain: Secondary | ICD-10-CM | POA: Insufficient documentation

## 2022-04-11 NOTE — Therapy (Addendum)
OUTPATIENT PHYSICAL THERAPY TREATMENT NOTE   Patient Name: Laurie Flores MRN: [id number] DOB:10/04/1965, 56 y.o., female Today's Date: 04/11/2022  PCP: Doyle Askew, PA-C REFERRING PROVIDER: Megan Salon, MD  END OF SESSION:   PT End of Session - 04/11/22 1608     Visit Number 4    Date for PT Re-Evaluation 04/25/22    Authorization Type BCBS    Authorization - Visit Number 4    Authorization - Number of Visits 80    PT Start Time 8257    PT Stop Time 4935    PT Time Calculation (min) 38 min    Activity Tolerance Patient tolerated treatment well    Behavior During Therapy [facility] for tasks assessed/performed             Past Medical History:  Diagnosis Date   Abnormal Pap smear 1/09   AGUS pap   Atypical nevus 01/28/2009   Right Upper Back -Moderate   Atypical nevus 05/04/2010   Right Low Paraspinal-Minimal, Left Mid Paraspinal-Mild to Moderate and Right Upper Paraspinal-Minimal   Atypical nevus 11/30/2011   Right Upper Paraspinal-Mild to Moderate   Atypical nevus 12/03/2013   Right Upper Back-Mild, and Right Lower Scapula-Moderate(widershave)   Atypical nevus 06/07/2016   Right Post Shoulder-Mild   GERD (gastroesophageal reflux disease)    H/O: C-section    IBS (irritable bowel syndrome)    ?   Past Surgical History:  Procedure Laterality Date   CESAREAN SECTION     CYSTOSCOPY  4/09   in office, secondary to microscopic hematuria   DILATION AND CURETTAGE OF UTERUS     NASAL SEPTUM SURGERY     TONSILLECTOMY     Patient Active Problem List   Diagnosis Date Noted   Right bundle branch block 03/15/2019   Family history of heart disease 03/15/2019   Vitamin D deficiency 10/16/2017   Porokeratosis 02/14/2017   Plantar fasciitis 02/14/2017   Intramural and subserous leiomyoma of uterus 10/29/2016   Anal fissure 10/29/2016   ANXIETY 09/06/2007   ALLERGIC RHINITIS, SEASONAL 09/06/2007   ASTHMA 09/06/2007   CONSTIPATION, CHRONIC 09/06/2007    ARTHRALGIA 09/06/2007   EPIGASTRIC PAIN 09/06/2007   Asthma 09/06/2007   IRRITABLE BOWEL SYNDROME 04/18/2007   GERD 09/19/2004   External hemorrhoids 11/19/2002   REFERRING DIAG: R10.2 (ICD-10-CM) - Pelvic pain   THERAPY DIAG:  Cramp and spasm   Pelvic pain   Other low back pain   Rationale for Evaluation and Treatment Rehabilitation   ONSET DATE: chronic   SUBJECTIVE:  SUBJECTIVE STATEMENT: I had another UTI. I have started my cycle. Since she had the UTI and now her cycle she is having the cramping again.   PAIN:  Are you having pain? Yes NPRS scale: 3/10, nagging fullness Pain location:  low abdominal that circles to the lumbar, fibroid pain in right lower quadrant   Pain type: aching Pain description: constant    Aggravating factors: bowel movement and urination Relieving factors: after the bowel movement and urination   PAIN:  Are you having pain? Yes: NPRS scale: 3/10, tightness Pain location: low back Pain description: ache, intermittent Aggravating factors: random and prior to urination Relieving factors: after urination     PRECAUTIONS: None   WEIGHT BEARING RESTRICTIONS No   FALLS:  Has patient fallen in last 6 months? 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   PLOF: Independent   PATIENT GOALS relieve pain, increase pelvic floor strength   PERTINENT HISTORY:  C-section; IBS     BOWEL MOVEMENT Pain with bowel movement: Yes, prior to bowel movement Type of bowel movement:Type (Bristol Stool Scale) type 4, Frequency 2-4 times per week, and Strain Yes, sometimes Fully empty rectum: Yes: sometimes she does not Leakage: No Pads: No Fiber supplement: Yes: metamucil    URINATION Pain with urination: No Fully empty bladder: No, not always and tries to sit and relax to fully empty Stream: Strong Urgency: Yes: starts to feel increased pressure and pain then goes away when she goes to the bathroom.  Frequency: 2-3 hours Leakage:  no Pads: No   INTERCOURSE Pain with intercourse:  no   PREGNANCY C-section deliveries 1 Currently pregnant No       OBJECTIVE:    DIAGNOSTIC FINDINGS:  none   PATIENT SURVEYS:  none     COGNITION:            Overall cognitive status: Within functional limits for tasks assessed                                         POSTURE: No Significant postural limitations               PELVIC ALIGNMENT:   LUMBARAROM/PROM full lumbar ROM     LOWER EXTREMITY ROM: Full bilateral hip ROM    (Blank rows = not tested)   LOWER EXTREMITY MMT:   MMT Right eval Left eval Right  04/11/2022 Left 04/11/2022  Hip abduction 4/5 4/5 5/5 5/5     PALPATION:   General  tenderness located on the right lower quadrant                 External Perineal Exam good coloring of tissue                             Internal Pelvic Floor tenderness located in bilateral obturator internist and levator ani   Patient confirms identification and approves PT to assess internal pelvic floor and treatment Yes   PELVIC MMT:   MMT eval  Vaginal 4/5  (Blank rows = not tested)         TONE: increased   PROLAPSE: none   TODAY'S TREATMENT  04/11/2022 Manual: Myofascial release:fascial release with one hand on the sacrum and  other on the lower abdomen going through the restrictions; tissue rolling of the abdomen, release of the mesenteric root, lifting up the intestines off the bladder    02/28/2022 Manual: Internal pelvic floor techniques:No emotional/communication barriers or cognitive limitation. Patient is motivated to learn. Patient understands and agrees with treatment goals and plan. PT explains patient will be examined in  standing, sitting, and lying down to see how their muscles and joints work. When they are ready, they will be asked to remove their underwear so PT can examine their perineum. The patient is also given the option of providing their own chaperone as one is not provided in our facility. The patient also has the right and is explained the right to defer or refuse any part of the evaluation or treatment including the internal exam. With the patient's consent, PT will use one gloved finger to gently assess the muscles of the pelvic floor, seeing how well it contracts and relaxes and if there is muscle symmetry. After, the patient will get dressed and PT and patient will discuss exam findings and plan of care. PT and patient discuss plan of care, schedule, attendance policy and HEP activities.  Going into the vaginal canal working on the levator ani, perineal body, and right obturator internist with movement of right hip and contract relax to reduce the trigger points.  Exercises: Stretches/mobility:single knee to chest holding 15 sec then do the other.  Double knee to chest with knees apart holding for 30 seconds Hip piriformis stretch holding 30 sec bil.  Prone press ups 10x      02/18/2022 Manual: Soft tissue mobilization:to bilateral diaphragm Myofascial release:fascial release of the abdominal tissue in all directions and areas to reduce the tension Fascial release of the lower abdominal moving from pubic bone and umbilicus Tissue rolling of the abdomen  Manual work to the umbilicus with hip rotation Neuromuscular re-education: Down training:laying on floor with butterfly stretch performing diaphragmatic breathing to elongate the pelvic floor muscles for 5 minutes      PATIENT EDUCATION:  Education details: Access Code: [email or web address] ,educated patient on vaginal wand and how to massage her pelvic floor muscles Person educated: Patient Education method: Explanation, Demonstration, Tactile cues, Verbal  cues, and Handouts Education comprehension: verbalized understanding, returned demonstration, verbal cues required, tactile cues required, and needs further education     HOME EXERCISE PROGRAM: 01/31/2022 Access Code: [email or web address] URL: [email or web address] Date: 01/31/2022 Prepared by: Earlie Counts   Exercises - Supine Double Knee to Chest  - 1 x daily - 7 x weekly - 1 sets - 2 reps - 30 seconds hold - Supine Piriformis Stretch with Leg Straight  - 1 x daily - 7 x weekly - 1 sets - 2 reps - 30 sec hold - Child's Pose Stretch  - 1 x daily - 7 x weekly - 1 sets - 1 reps - 30 sec hold - Cat Cow  - 1 x daily - 7 x weekly - 1 sets - 10 reps - Seated Piriformis Stretch with Trunk Bend  - 1 x daily - 7 x weekly - 1 sets - 2 reps - 30 sec hold - Seated Hamstring Stretch  - 1 x daily - 7 x weekly - 1 sets - 2 reps - 30 sec hold   ASSESSMENT:   CLINICAL IMPRESSION: Patient is a 56 y.o. female who was seen today for physical therapy  treatment for pelvic pain. Patient is having more discomfort due to getting over a UTI and  starting her cycle after not having one in 8 months. Patient had tightness in the lower abdomen that were released with manual work.  She has increased strength of her hip abductors. Therapist was not able to do internal work due to patient being on her cycle. Patient is doing her HEP. Patient will benefit from skilled therapy to reduce pain to improve quality of life.      OBJECTIVE IMPAIRMENTS decreased activity tolerance, increased fascial restrictions, increased muscle spasms, and pain.    ACTIVITY LIMITATIONS toileting   PARTICIPATION LIMITATIONS:  none   PERSONAL FACTORS C-section; IBS are also affecting patient's functional outcome.    REHAB POTENTIAL: Excellent   CLINICAL DECISION MAKING: Stable/uncomplicated   EVALUATION COMPLEXITY: Low     GOALS: Goals reviewed with patient? Yes   SHORT TERM GOALS: Target date: 02/28/2022   Patient understands  how to massage the pelvic floor muscles to reduce trigger points.  Baseline: Goal status: met 02/18/2022   2.  Patient independent with hip and back stretches to reduce her pain.  Baseline:  Goal status: met 02/28/2022       LONG TERM GOALS: Target date: 04/25/2022    Patient independent with advanced HEP including management of pain using the vaginal wand Baseline:  Goal status: ongoing 04/11/2022   2.  Patient reports her pain prior to urination is </= 1/10 due to reduction of trigger points.  Baseline:  Goal status: ongoing 04/11/2022   3.  Patient reports her pain prior to bowel movements </= 1/10 due to reduction of trigger points.  Baseline:  Goal status: ongoing 04/11/2022     PLAN: PT FREQUENCY: 1x/week   PT DURATION: 12 weeks   PLANNED INTERVENTIONS: Therapeutic exercises, Therapeutic activity, Neuromuscular re-education, Balance training, Patient/Family education, Self Care, Joint mobilization, Dry Needling, Electrical stimulation, Cryotherapy, Moist heat, Taping, and Manual therapy   PLAN FOR NEXT SESSION: On hold till she sees Dr. Sabra Heck then be reassessed.  Earlie Counts, PT 04/11/22 4:54 PM    PHYSICAL THERAPY DISCHARGE SUMMARY  Visits from Start of Care: 4  Current functional level related to goals / functional outcomes: See above.    Remaining deficits: See above. Therapy did not help her pressure feeling.    Education / Equipment: HEP   Patient agrees to discharge. Patient goals were not met. Patient is being discharged due to did not respond to therapy.  Thank you for the referral. Earlie Counts, PT 07/19/22 3:58 PM

## 2022-04-16 DIAGNOSIS — R350 Frequency of micturition: Secondary | ICD-10-CM | POA: Diagnosis not present

## 2022-04-27 ENCOUNTER — Telehealth (HOSPITAL_BASED_OUTPATIENT_CLINIC_OR_DEPARTMENT_OTHER): Payer: Self-pay | Admitting: Obstetrics & Gynecology

## 2022-04-27 NOTE — Telephone Encounter (Signed)
Patient called and stated she is still having pelvic pain .

## 2022-05-02 NOTE — Telephone Encounter (Signed)
LMOVM returning pts call.  

## 2022-05-03 ENCOUNTER — Encounter (HOSPITAL_BASED_OUTPATIENT_CLINIC_OR_DEPARTMENT_OTHER): Payer: Self-pay | Admitting: Obstetrics & Gynecology

## 2022-05-03 ENCOUNTER — Ambulatory Visit (HOSPITAL_BASED_OUTPATIENT_CLINIC_OR_DEPARTMENT_OTHER): Payer: BC Managed Care – PPO | Admitting: Obstetrics & Gynecology

## 2022-05-03 VITALS — BP 129/79 | HR 68 | Ht 65.0 in | Wt 149.0 lb

## 2022-05-03 DIAGNOSIS — R300 Dysuria: Secondary | ICD-10-CM | POA: Diagnosis not present

## 2022-05-03 DIAGNOSIS — R102 Pelvic and perineal pain: Secondary | ICD-10-CM

## 2022-05-03 LAB — POCT URINALYSIS DIPSTICK
Bilirubin, UA: NEGATIVE
Blood, UA: NEGATIVE
Glucose, UA: NEGATIVE
Ketones, UA: NEGATIVE
Leukocytes, UA: NEGATIVE
Nitrite, UA: NEGATIVE
Protein, UA: NEGATIVE
Spec Grav, UA: <=1.005 — AB (ref 1.010–1.025)
Urobilinogen, UA: 0.2 E.U./dL
pH, UA: 6 (ref 5.0–8.0)

## 2022-05-03 NOTE — Progress Notes (Unsigned)
GYNECOLOGY  VISIT  CC:   pelvic pain/pressure  HPI: 56 y.o. G90P0011 Married White or Caucasian female here for complaint of continued pelvic pain/pressure.  This has been an intermittent problem for many years.  She reports it feels like she is pregnant with the sensation of pelvic pressure and then over the last few weeks, she feels like there is a burning sensation as well.  She feels this wraps around from her back to her front.  She has described this to me multiple times in the back but never quite like today with complaint of this wrapping around from her back to her front.  Denies vaginal bleeding.  Did do pelvic PT with [facility] but this didn't seem to help.  Seeing GI tomorrow.  Last colonoscopy was 2014.  She does have some constipation issues.  Does need stool softeners from time to time.  POCT for urine done today was normal.  Ultrasound in June continued to show fibroids which were smaller so I feel not the cause of her issues.   Denies vaginal bleeding.  Denies dysuria.    Past Medical History:  Diagnosis Date   Abnormal Pap smear 1/09   AGUS pap   Atypical nevus 01/28/2009   Right Upper Back -Moderate   Atypical nevus 05/04/2010   Right Low Paraspinal-Minimal, Left Mid Paraspinal-Mild to Moderate and Right Upper Paraspinal-Minimal   Atypical nevus 11/30/2011   Right Upper Paraspinal-Mild to Moderate   Atypical nevus 12/03/2013   Right Upper Back-Mild, and Right Lower Scapula-Moderate(widershave)   Atypical nevus 06/07/2016   Right Post Shoulder-Mild   GERD (gastroesophageal reflux disease)    H/O: C-section    IBS (irritable bowel syndrome)    ?    MEDS:   Current Outpatient Medications on File Prior to Visit  Medication Sig Dispense Refill   Azelastine HCl (ASTELIN NA) Place into the nose daily.      Cholecalciferol (VITAMIN D PO) Take 2,000 Units by mouth daily.      cyclobenzaprine (FLEXERIL) 10 MG tablet Take 1 tablet at night for pelvic floor pain.  If this  helps, can increase to TID prn for pelvic floor pain 30 tablet 0   famotidine (PEPCID) 20 MG tablet Take 20 mg by mouth at bedtime.     fexofenadine (ALLEGRA) 60 MG tablet Take 60 mg by mouth daily.     fluticasone (FLONASE) 50 MCG/ACT nasal spray Place 2 sprays into the nose daily.     Magnesium 200 MG TABS Take 200 mg by mouth daily.     omeprazole (PRILOSEC) 20 MG capsule Take 20 mg by mouth daily.     PROAIR HFA 108 (90 Base) MCG/ACT inhaler      Probiotic Product (SOLUBLE FIBER/PROBIOTICS PO) Take by mouth daily.     QVAR REDIHALER 80 MCG/ACT inhaler 1 puff daily.     No current facility-administered medications on file prior to visit.    ALLERGIES: Amoxicillin-pot clavulanate, Ciprofloxacin, Shellfish allergy, and Sulfa antibiotics  SH:  married, non smoker  Review of Systems  Constitutional: Negative.   Genitourinary:        Pelvic pain/pressure    PHYSICAL EXAMINATION:    BP 129/79 (BP Location: Right Arm, Patient Position: Sitting, Cuff Size: Large)   Pulse 68   Ht '5\' 5"'$  (1.651 m) Comment: Reported  Wt 149 lb (67.6 kg)   BMI 24.79 kg/m     General appearance: alert, cooperative and appears stated age Abdomen: soft, non-tender; bowel sounds normal;  no masses,  no organomegaly Lymph:  no inguinal LAD noted  Pelvic: External genitalia:  no lesions              Urethra:  normal appearing urethra with no masses, tenderness or lesions              Bartholins and Skenes: normal                 Vagina: normal appearing vagina with normal color and discharge, no lesions              Cervix: no lesions              Bimanual Exam:  Uterus:  enlarged, 8-10 weeks size              Adnexa: no mass, fullness, tenderness  Chaperone, Octaviano Batty, CMA, was present for exam.  Assessment/Plan: 1. Pelvic pain - for a while in the past, pt is aware I really thought this was due to her fibroid uterus but this was smaller on most recent ultrasound without any change in symptoms.  In  fact, her symptoms are worse.  And pelvic PT did not do anything. - She is seeing GI tomorrow.  She will let me know what they discuss and plan.  Recommend CT if GI doesn't think any additional testing needed.  Renal function will be obtained today to ensure contrast ok. - Basic metabolic panel - consider sports medicine evaluation of low back as well.  Pt will reach back out to me to let me know.    2. Dysuria - POCT Urinalysis Dipstick

## 2022-05-04 DIAGNOSIS — Z0000 Encounter for general adult medical examination without abnormal findings: Secondary | ICD-10-CM | POA: Diagnosis not present

## 2022-05-04 DIAGNOSIS — R102 Pelvic and perineal pain: Secondary | ICD-10-CM | POA: Insufficient documentation

## 2022-05-04 DIAGNOSIS — R1084 Generalized abdominal pain: Secondary | ICD-10-CM | POA: Diagnosis not present

## 2022-05-04 DIAGNOSIS — K219 Gastro-esophageal reflux disease without esophagitis: Secondary | ICD-10-CM | POA: Diagnosis not present

## 2022-05-04 DIAGNOSIS — K5900 Constipation, unspecified: Secondary | ICD-10-CM | POA: Diagnosis not present

## 2022-05-04 DIAGNOSIS — K921 Melena: Secondary | ICD-10-CM | POA: Diagnosis not present

## 2022-05-04 LAB — BASIC METABOLIC PANEL
BUN/Creatinine Ratio: 14 (ref 9–23)
BUN: 12 mg/dL (ref 6–24)
CO2: 23 mmol/L (ref 20–29)
Calcium: 9.3 mg/dL (ref 8.7–10.2)
Chloride: 103 mmol/L (ref 96–106)
Creatinine, Ser: 0.88 mg/dL (ref 0.57–1.00)
Glucose: 93 mg/dL (ref 70–99)
Potassium: 4.2 mmol/L (ref 3.5–5.2)
Sodium: 140 mmol/L (ref 134–144)
eGFR: 78 mL/min/{1.73_m2} (ref >59)

## 2022-05-05 ENCOUNTER — Encounter (HOSPITAL_BASED_OUTPATIENT_CLINIC_OR_DEPARTMENT_OTHER): Payer: Self-pay | Admitting: Obstetrics & Gynecology

## 2022-05-20 ENCOUNTER — Ambulatory Visit (HOSPITAL_BASED_OUTPATIENT_CLINIC_OR_DEPARTMENT_OTHER): Payer: BC Managed Care – PPO | Admitting: Obstetrics & Gynecology

## 2022-06-14 DIAGNOSIS — J0140 Acute pansinusitis, unspecified: Secondary | ICD-10-CM | POA: Diagnosis not present

## 2022-07-26 DIAGNOSIS — K921 Melena: Secondary | ICD-10-CM | POA: Diagnosis not present

## 2022-07-26 DIAGNOSIS — K648 Other hemorrhoids: Secondary | ICD-10-CM | POA: Diagnosis not present

## 2022-09-16 DIAGNOSIS — J343 Hypertrophy of nasal turbinates: Secondary | ICD-10-CM | POA: Diagnosis not present

## 2022-09-16 DIAGNOSIS — J320 Chronic maxillary sinusitis: Secondary | ICD-10-CM | POA: Diagnosis not present

## 2022-10-12 DIAGNOSIS — J324 Chronic pansinusitis: Secondary | ICD-10-CM | POA: Diagnosis not present

## 2022-10-12 DIAGNOSIS — J343 Hypertrophy of nasal turbinates: Secondary | ICD-10-CM | POA: Diagnosis not present

## 2022-11-01 ENCOUNTER — Ambulatory Visit: Payer: BC Managed Care – PPO | Admitting: Physician Assistant

## 2022-11-01 DIAGNOSIS — B028 Zoster with other complications: Secondary | ICD-10-CM | POA: Diagnosis not present

## 2022-11-04 DIAGNOSIS — B028 Zoster with other complications: Secondary | ICD-10-CM | POA: Diagnosis not present

## 2022-11-07 DIAGNOSIS — J324 Chronic pansinusitis: Secondary | ICD-10-CM | POA: Diagnosis not present

## 2022-11-07 DIAGNOSIS — J343 Hypertrophy of nasal turbinates: Secondary | ICD-10-CM | POA: Diagnosis not present

## 2022-11-24 DIAGNOSIS — M79603 Pain in arm, unspecified: Secondary | ICD-10-CM | POA: Diagnosis not present

## 2022-11-24 DIAGNOSIS — B0229 Other postherpetic nervous system involvement: Secondary | ICD-10-CM | POA: Diagnosis not present

## 2023-01-03 ENCOUNTER — Ambulatory Visit: Payer: BC Managed Care – PPO | Admitting: Cardiovascular Disease

## 2023-01-30 ENCOUNTER — Ambulatory Visit (HOSPITAL_BASED_OUTPATIENT_CLINIC_OR_DEPARTMENT_OTHER): Payer: BC Managed Care – PPO | Admitting: Obstetrics & Gynecology

## 2023-02-03 DIAGNOSIS — J0140 Acute pansinusitis, unspecified: Secondary | ICD-10-CM | POA: Diagnosis not present

## 2023-02-03 DIAGNOSIS — R5383 Other fatigue: Secondary | ICD-10-CM | POA: Diagnosis not present

## 2023-02-20 DIAGNOSIS — R109 Unspecified abdominal pain: Secondary | ICD-10-CM | POA: Diagnosis not present

## 2023-02-20 DIAGNOSIS — R9389 Abnormal findings on diagnostic imaging of other specified body structures: Secondary | ICD-10-CM | POA: Diagnosis not present

## 2023-02-28 ENCOUNTER — Ambulatory Visit (HOSPITAL_BASED_OUTPATIENT_CLINIC_OR_DEPARTMENT_OTHER): Payer: BC Managed Care – PPO | Admitting: Obstetrics & Gynecology

## 2023-02-28 ENCOUNTER — Encounter (HOSPITAL_BASED_OUTPATIENT_CLINIC_OR_DEPARTMENT_OTHER): Payer: Self-pay | Admitting: Obstetrics & Gynecology

## 2023-02-28 VITALS — BP 130/70 | HR 57 | Ht 65.0 in | Wt 150.4 lb

## 2023-02-28 DIAGNOSIS — N952 Postmenopausal atrophic vaginitis: Secondary | ICD-10-CM

## 2023-02-28 DIAGNOSIS — R1031 Right lower quadrant pain: Secondary | ICD-10-CM | POA: Diagnosis not present

## 2023-02-28 DIAGNOSIS — I4510 Unspecified right bundle-branch block: Secondary | ICD-10-CM

## 2023-02-28 DIAGNOSIS — G8929 Other chronic pain: Secondary | ICD-10-CM

## 2023-02-28 DIAGNOSIS — Z01419 Encounter for gynecological examination (general) (routine) without abnormal findings: Secondary | ICD-10-CM

## 2023-02-28 DIAGNOSIS — Z86018 Personal history of other benign neoplasm: Secondary | ICD-10-CM

## 2023-02-28 MED ORDER — ESTRADIOL 0.1 MG/GM VA CREA: TOPICAL_CREAM | VAGINAL | 2 refills | Status: DC

## 2023-02-28 NOTE — Progress Notes (Signed)
57 y.o. G25P0011 Married White or Caucasian female here for annual exam.  Continues to have pelvic pain.  She did have an ultrasound and a CT scan.  The CT was normal except for possible adnexal mass measuring 4.4cm which is consistent with uterine fibroids that were know.  Moderate to large volume of stool was noted as well.    She did have another UTI that was treated.  She does have some vaginal burning/irritation sensation.  Vaginal estrogen discussed.  She is open to trying this.      No LMP recorded. Patient is perimenopausal.          Sexually active: Yes.    The current method of family planning is post menopausal status.    Smoker:  no  Health Maintenance: Pap:  10/05/2021 Negative History of abnormal Pap:  no MMG:  08/2020.  Scheduled 820/2024 Colonoscopy:  07/2022 at [facility].  Cannot review report but can see it was done and pt reports this was normal. BMD:   not ordered at this time Screening Labs: does with PCP   reports that she has never smoked. She has never used smokeless tobacco. She reports that she does not drink alcohol and does not use drugs.  Past Medical History:  Diagnosis Date   Abnormal Pap smear 1/09   AGUS pap   Atypical nevus 01/28/2009   Right Upper Back -Moderate   Atypical nevus 05/04/2010   Right Low Paraspinal-Minimal, Left Mid Paraspinal-Mild to Moderate and Right Upper Paraspinal-Minimal   Atypical nevus 11/30/2011   Right Upper Paraspinal-Mild to Moderate   Atypical nevus 12/03/2013   Right Upper Back-Mild, and Right Lower Scapula-Moderate(widershave)   Atypical nevus 06/07/2016   Right Post Shoulder-Mild   GERD (gastroesophageal reflux disease)    H/O: C-section    IBS (irritable bowel syndrome)    ?    Past Surgical History:  Procedure Laterality Date   CESAREAN SECTION     CYSTOSCOPY  4/09   in office, secondary to microscopic hematuria   DILATION AND CURETTAGE OF UTERUS     NASAL SEPTUM SURGERY     TONSILLECTOMY       Current Outpatient Medications  Medication Sig Dispense Refill   Azelastine HCl (ASTELIN NA) Place into the nose daily.      Cholecalciferol (VITAMIN D PO) Take 2,000 Units by mouth daily.      estradiol (ESTRACE) 0.1 MG/GM vaginal cream 1 gram vaginally twice weekly 42.5 g 2   famotidine (PEPCID) 20 MG tablet Take 20 mg by mouth at bedtime.     fexofenadine (ALLEGRA) 60 MG tablet Take 60 mg by mouth daily.     fluticasone (FLONASE) 50 MCG/ACT nasal spray Place 2 sprays into the nose daily.     Magnesium 200 MG TABS Take 200 mg by mouth daily.     omeprazole (PRILOSEC) 20 MG capsule Take 20 mg by mouth daily.     PROAIR HFA 108 (90 Base) MCG/ACT inhaler      Probiotic Product (SOLUBLE FIBER/PROBIOTICS PO) Take by mouth daily.     QVAR REDIHALER 80 MCG/ACT inhaler 1 puff daily. (Patient not taking: Reported on 02/28/2023)     No current facility-administered medications for this visit.    Family History  Problem Relation Age of Onset   Hypertension Mother    Osteoporosis Mother    Colon polyps Mother    Hypertension Father    Heart disease Father    Osteoporosis Sister    Breast  cancer Maternal Grandmother    Rheum arthritis Sister        age 27   Arthritis Sister        central spinal cord syndrome age 36    ROS: Constitutional: negative Genitourinary:negative  Exam:   BP 130/70 (BP Location: Right Arm, Patient Position: Sitting, Cuff Size: Normal)   Pulse (!) 57   Ht 5\' 5"  (1.651 m) Comment: Reported  Wt 150 lb 6.4 oz (68.2 kg)   BMI 25.03 kg/m   Height: 5\' 5"  (165.1 cm) (Reported)  General appearance: alert, cooperative and appears stated age Head: Normocephalic, without obvious abnormality, atraumatic Neck: no adenopathy, supple, symmetrical, trachea midline and thyroid normal to inspection and palpation Lungs: clear to auscultation bilaterally Breasts: normal appearance, no masses or tenderness Heart: regular rate and rhythm Abdomen: soft, non-tender; bowel  sounds normal; no masses,  no organomegaly Extremities: extremities normal, atraumatic, no cyanosis or edema Skin: Skin color, texture, turgor normal. No rashes or lesions Lymph nodes: Cervical, supraclavicular, and axillary nodes normal. No abnormal inguinal nodes palpated Neurologic: Grossly normal   Pelvic: External genitalia:  no lesions              Urethra:  normal appearing urethra with no masses, tenderness or lesions              Bartholins and Skenes: normal                 Vagina: atrophic changes noted              Cervix: no lesions              Pap taken: No. Bimanual Exam:  Uterus:  normal, mobile, no masses, non tender              Adnexa: no mass, fullness, tenderness               Rectovaginal: Confirms               Anus:  normal sphincter tone, no lesions  Chaperone, Ina Homes, CMA, was present for exam.  Assessment/Plan: 1. Well woman exam with routine gynecological exam - Pap smear neg with neg HR HPV 10/05/2021 - Mammogram 08/2020.  Pt aware this is due. - Colonoscopy 07/2022 at [facility] - Bone mineral density not indicated at this time - lab work done with PCP - vaccines reviewed/updated  2. Vaginal atrophy - estradiol (ESTRACE) 0.1 MG/GM vaginal cream; 1 gram vaginally twice weekly  Dispense: 42.5 g; Refill: 2  3. Chronic RLQ pain - has undergone ultrasound evaluation, seen PT, had colonoscopy 07/26/2022 and had CT 02/20/2023.  Pt aware that I don't have any other testing to recommend at this time.  She is not interested in surgery.    4. Right bundle branch block - followed by cardiology  5. History of uterine fibroid - CT showed probable 4cm fibroid and u/s done 01/06/2022 and fibroid was 4.9cm.  pt does not desire removal at this time.

## 2023-03-07 DIAGNOSIS — Z1231 Encounter for screening mammogram for malignant neoplasm of breast: Secondary | ICD-10-CM | POA: Diagnosis not present

## 2023-03-07 DIAGNOSIS — R92323 Mammographic fibroglandular density, bilateral breasts: Secondary | ICD-10-CM | POA: Diagnosis not present

## 2023-03-10 ENCOUNTER — Ambulatory Visit: Payer: BC Managed Care – PPO | Admitting: Cardiovascular Disease

## 2023-05-08 ENCOUNTER — Ambulatory Visit: Payer: BC Managed Care – PPO | Admitting: Cardiovascular Disease

## 2023-05-15 ENCOUNTER — Ambulatory Visit (HOSPITAL_BASED_OUTPATIENT_CLINIC_OR_DEPARTMENT_OTHER): Payer: BC Managed Care – PPO | Admitting: Obstetrics & Gynecology

## 2023-05-17 DIAGNOSIS — Z1329 Encounter for screening for other suspected endocrine disorder: Secondary | ICD-10-CM | POA: Diagnosis not present

## 2023-05-17 DIAGNOSIS — Z13220 Encounter for screening for lipoid disorders: Secondary | ICD-10-CM | POA: Diagnosis not present

## 2023-05-17 DIAGNOSIS — Z0000 Encounter for general adult medical examination without abnormal findings: Secondary | ICD-10-CM | POA: Diagnosis not present

## 2023-05-17 DIAGNOSIS — R5383 Other fatigue: Secondary | ICD-10-CM | POA: Diagnosis not present

## 2023-06-12 DIAGNOSIS — H1045 Other chronic allergic conjunctivitis: Secondary | ICD-10-CM | POA: Diagnosis not present

## 2023-06-12 DIAGNOSIS — H43812 Vitreous degeneration, left eye: Secondary | ICD-10-CM | POA: Diagnosis not present

## 2023-07-05 ENCOUNTER — Ambulatory Visit: Payer: BC Managed Care – PPO | Admitting: Dermatology

## 2023-07-05 ENCOUNTER — Encounter: Payer: Self-pay | Admitting: Dermatology

## 2023-07-05 VITALS — BP 143/88

## 2023-07-05 DIAGNOSIS — Z1283 Encounter for screening for malignant neoplasm of skin: Secondary | ICD-10-CM | POA: Diagnosis not present

## 2023-07-05 DIAGNOSIS — D1801 Hemangioma of skin and subcutaneous tissue: Secondary | ICD-10-CM | POA: Diagnosis not present

## 2023-07-05 DIAGNOSIS — D229 Melanocytic nevi, unspecified: Secondary | ICD-10-CM

## 2023-07-05 DIAGNOSIS — L821 Other seborrheic keratosis: Secondary | ICD-10-CM

## 2023-07-05 DIAGNOSIS — W908XXA Exposure to other nonionizing radiation, initial encounter: Secondary | ICD-10-CM

## 2023-07-05 DIAGNOSIS — L578 Other skin changes due to chronic exposure to nonionizing radiation: Secondary | ICD-10-CM

## 2023-07-05 DIAGNOSIS — L814 Other melanin hyperpigmentation: Secondary | ICD-10-CM

## 2023-07-05 NOTE — Progress Notes (Signed)
   New Patient Visit   Subjective  Laurie Flores is a 57 y.o. female who presents for the following:  Total Body Skin Exam (TBSE)  Patient present today for new patient visit for TBSE.The patient denies she has spots, moles and lesions to be evaluated, some may be new or changing and the patient may have concern these could be cancer. Patient has previously been treated by a dermatologist by Dr. Sherryl Barters office a year ago. Patient reports she has hx of bx but samples were benign. Patient denies family history of skin cancers. Patient reports throughout her lifetime has had moderate sun exposure. Currently, patient reports if she has excessive sun exposure, she does apply sunscreen and/or wears protective coverings.  The following portions of the chart were reviewed this encounter and updated as appropriate: medications, allergies, medical history  Review of Systems:  No other skin or systemic complaints except as noted in HPI or Assessment and Plan.  Objective  Well appearing patient in no apparent distress; mood and affect are within normal limits.  A full examination was performed including scalp, head, eyes, ears, nose, lips, neck, chest, axillae, abdomen, back, buttocks, bilateral upper extremities, bilateral lower extremities, hands, feet, fingers, toes, fingernails, and toenails. All findings within normal limits unless otherwise noted below.     Relevant exam findings are noted in the Assessment and Plan.    Assessment & Plan   LENTIGINES, SEBORRHEIC KERATOSES, HEMANGIOMAS - Benign normal skin lesions - Benign-appearing - Call for any changes  MELANOCYTIC NEVI - Tan-brown and/or pink-flesh-colored symmetric macules and papules - Benign appearing on exam today - Observation - Call clinic for new or changing moles - Recommend daily use of broad spectrum spf 30+ sunscreen to sun-exposed areas.   ACTINIC DAMAGE - Chronic condition, secondary to cumulative UV/sun exposure -  diffuse scaly erythematous macules with underlying dyspigmentation - Recommend daily broad spectrum sunscreen SPF 30+ to sun-exposed areas, reapply every 2 hours as needed.  - Staying in the shade or wearing long sleeves, sun glasses (UVA+UVB protection) and wide brim hats (4-inch brim around the entire circumference of the hat) are also recommended for sun protection.  - Call for new or changing lesions.  SKIN CANCER SCREENING PERFORMED TODAY SKIN EXAM FOR MALIGNANT NEOPLASM   MULTIPLE BENIGN MELANOCYTIC NEVI   CHERRY ANGIOMA   LENTIGINES   SEBORRHEIC KERATOSIS   ACTINIC SKIN DAMAGE    No follow-ups on file.   Documentation: I have reviewed the above documentation for accuracy and completeness, and I agree with the above.   I, Shirron Marcha Solders, CMA, am acting as scribe for [employer], DO.   Langston Reusing, DO

## 2023-07-05 NOTE — Patient Instructions (Signed)

## 2023-07-18 DIAGNOSIS — H1045 Other chronic allergic conjunctivitis: Secondary | ICD-10-CM | POA: Diagnosis not present

## 2023-07-18 DIAGNOSIS — H43812 Vitreous degeneration, left eye: Secondary | ICD-10-CM | POA: Diagnosis not present

## 2023-07-20 ENCOUNTER — Ambulatory Visit: Payer: BC Managed Care – PPO | Admitting: Dermatology

## 2023-08-11 DIAGNOSIS — J0140 Acute pansinusitis, unspecified: Secondary | ICD-10-CM | POA: Diagnosis not present

## 2023-09-21 DIAGNOSIS — R0789 Other chest pain: Secondary | ICD-10-CM | POA: Diagnosis not present

## 2023-09-21 DIAGNOSIS — J988 Other specified respiratory disorders: Secondary | ICD-10-CM | POA: Diagnosis not present

## 2023-09-21 DIAGNOSIS — R52 Pain, unspecified: Secondary | ICD-10-CM | POA: Diagnosis not present

## 2023-10-09 DIAGNOSIS — M4726 Other spondylosis with radiculopathy, lumbar region: Secondary | ICD-10-CM | POA: Diagnosis not present

## 2023-10-09 DIAGNOSIS — M4723 Other spondylosis with radiculopathy, cervicothoracic region: Secondary | ICD-10-CM | POA: Diagnosis not present

## 2023-10-09 DIAGNOSIS — M4728 Other spondylosis with radiculopathy, sacral and sacrococcygeal region: Secondary | ICD-10-CM | POA: Diagnosis not present

## 2023-10-09 DIAGNOSIS — M4724 Other spondylosis with radiculopathy, thoracic region: Secondary | ICD-10-CM | POA: Diagnosis not present

## 2023-10-10 ENCOUNTER — Encounter (HOSPITAL_BASED_OUTPATIENT_CLINIC_OR_DEPARTMENT_OTHER): Payer: Self-pay | Admitting: Obstetrics & Gynecology

## 2023-10-10 DIAGNOSIS — M4724 Other spondylosis with radiculopathy, thoracic region: Secondary | ICD-10-CM | POA: Diagnosis not present

## 2023-10-10 DIAGNOSIS — M4723 Other spondylosis with radiculopathy, cervicothoracic region: Secondary | ICD-10-CM | POA: Diagnosis not present

## 2023-10-10 DIAGNOSIS — M4728 Other spondylosis with radiculopathy, sacral and sacrococcygeal region: Secondary | ICD-10-CM | POA: Diagnosis not present

## 2023-10-10 DIAGNOSIS — M4726 Other spondylosis with radiculopathy, lumbar region: Secondary | ICD-10-CM | POA: Diagnosis not present

## 2023-10-11 DIAGNOSIS — M4726 Other spondylosis with radiculopathy, lumbar region: Secondary | ICD-10-CM | POA: Diagnosis not present

## 2023-10-11 DIAGNOSIS — M4724 Other spondylosis with radiculopathy, thoracic region: Secondary | ICD-10-CM | POA: Diagnosis not present

## 2023-10-11 DIAGNOSIS — M4723 Other spondylosis with radiculopathy, cervicothoracic region: Secondary | ICD-10-CM | POA: Diagnosis not present

## 2023-10-11 DIAGNOSIS — M4728 Other spondylosis with radiculopathy, sacral and sacrococcygeal region: Secondary | ICD-10-CM | POA: Diagnosis not present

## 2023-10-12 DIAGNOSIS — M4726 Other spondylosis with radiculopathy, lumbar region: Secondary | ICD-10-CM | POA: Diagnosis not present

## 2023-10-12 DIAGNOSIS — M4728 Other spondylosis with radiculopathy, sacral and sacrococcygeal region: Secondary | ICD-10-CM | POA: Diagnosis not present

## 2023-10-12 DIAGNOSIS — M4723 Other spondylosis with radiculopathy, cervicothoracic region: Secondary | ICD-10-CM | POA: Diagnosis not present

## 2023-10-12 DIAGNOSIS — M4724 Other spondylosis with radiculopathy, thoracic region: Secondary | ICD-10-CM | POA: Diagnosis not present

## 2023-10-16 DIAGNOSIS — M4726 Other spondylosis with radiculopathy, lumbar region: Secondary | ICD-10-CM | POA: Diagnosis not present

## 2023-10-16 DIAGNOSIS — M4728 Other spondylosis with radiculopathy, sacral and sacrococcygeal region: Secondary | ICD-10-CM | POA: Diagnosis not present

## 2023-10-16 DIAGNOSIS — M4724 Other spondylosis with radiculopathy, thoracic region: Secondary | ICD-10-CM | POA: Diagnosis not present

## 2023-10-16 DIAGNOSIS — M4723 Other spondylosis with radiculopathy, cervicothoracic region: Secondary | ICD-10-CM | POA: Diagnosis not present

## 2023-10-17 DIAGNOSIS — M4726 Other spondylosis with radiculopathy, lumbar region: Secondary | ICD-10-CM | POA: Diagnosis not present

## 2023-10-17 DIAGNOSIS — M4724 Other spondylosis with radiculopathy, thoracic region: Secondary | ICD-10-CM | POA: Diagnosis not present

## 2023-10-17 DIAGNOSIS — M4723 Other spondylosis with radiculopathy, cervicothoracic region: Secondary | ICD-10-CM | POA: Diagnosis not present

## 2023-10-17 DIAGNOSIS — M4728 Other spondylosis with radiculopathy, sacral and sacrococcygeal region: Secondary | ICD-10-CM | POA: Diagnosis not present

## 2023-10-20 ENCOUNTER — Encounter (HOSPITAL_BASED_OUTPATIENT_CLINIC_OR_DEPARTMENT_OTHER): Payer: Self-pay | Admitting: Obstetrics & Gynecology

## 2023-10-20 ENCOUNTER — Ambulatory Visit (HOSPITAL_BASED_OUTPATIENT_CLINIC_OR_DEPARTMENT_OTHER): Admitting: Obstetrics & Gynecology

## 2023-10-20 VITALS — BP 120/74 | HR 61 | Ht 65.0 in | Wt 154.6 lb

## 2023-10-20 DIAGNOSIS — N912 Amenorrhea, unspecified: Secondary | ICD-10-CM

## 2023-10-20 DIAGNOSIS — E7800 Pure hypercholesterolemia, unspecified: Secondary | ICD-10-CM | POA: Diagnosis not present

## 2023-10-20 DIAGNOSIS — R6882 Decreased libido: Secondary | ICD-10-CM

## 2023-10-20 DIAGNOSIS — N951 Menopausal and female climacteric states: Secondary | ICD-10-CM

## 2023-10-20 MED ORDER — ESTRADIOL 0.05 MG/24HR TD PTTW: 1.0000 | MEDICATED_PATCH | TRANSDERMAL | 2 refills | Status: DC

## 2023-10-20 MED ORDER — PROGESTERONE 200 MG PO CAPS: ORAL_CAPSULE | ORAL | 0 refills | Status: DC

## 2023-10-20 NOTE — Progress Notes (Signed)
 GYNECOLOGY  VISIT  CC:   discuss hormones/testing  HPI: 58 y.o. G56P0011 Married White or Caucasian female here for discussion of what feels like hormone related symptoms.  Last cycle about a year ago.  Having a lot of dry eyes and skin changes.  Having a lot of aching.  Not sleeping well.  Having night sweats.  Would like to consider some lab testing and treatment options.  Also, she is having decreased libido.  Having headaches but these are sinus related.  Does not have migraines or hx of migraines.    Risks and benefits discussed of CVD including MI, stroke, breast cancer as well as the possibility of restarting bleeding.  Relation of HRT to lipids discussed.  Questions answered.   Past Medical History:  Diagnosis Date   Abnormal Pap smear 1/09   AGUS pap   Atypical nevus 01/28/2009   Right Upper Back -Moderate   Atypical nevus 05/04/2010   Right Low Paraspinal-Minimal, Left Mid Paraspinal-Mild to Moderate and Right Upper Paraspinal-Minimal   Atypical nevus 11/30/2011   Right Upper Paraspinal-Mild to Moderate   Atypical nevus 12/03/2013   Right Upper Back-Mild, and Right Lower Scapula-Moderate(widershave)   Atypical nevus 06/07/2016   Right Post Shoulder-Mild   GERD (gastroesophageal reflux disease)    H/O: C-section    IBS (irritable bowel syndrome)    ?    MEDS:   Current Outpatient Medications on File Prior to Visit  Medication Sig Dispense Refill   Azelastine HCl (ASTELIN NA) Place into the nose daily.      Cholecalciferol (VITAMIN D PO) Take 2,000 Units by mouth daily.      estradiol (ESTRACE) 0.1 MG/GM vaginal cream 1 gram vaginally twice weekly 42.5 g 2   famotidine (PEPCID) 20 MG tablet Take 20 mg by mouth at bedtime.     fexofenadine (ALLEGRA) 60 MG tablet Take 60 mg by mouth daily.     fluticasone (FLONASE) 50 MCG/ACT nasal spray Place 2 sprays into the nose daily.     Magnesium 200 MG TABS Take 200 mg by mouth daily.     omeprazole (PRILOSEC) 20 MG capsule Take  20 mg by mouth daily.     PROAIR HFA 108 (90 Base) MCG/ACT inhaler      Probiotic Product (SOLUBLE FIBER/PROBIOTICS PO) Take by mouth daily.     No current facility-administered medications on file prior to visit.    ALLERGIES: Amoxicillin-pot clavulanate, Ciprofloxacin, Shellfish allergy, and Sulfa antibiotics  SH:  married, non   Review of Systems  Constitutional:  Positive for malaise/fatigue.  Genitourinary: Negative.   Endo/Heme/Allergies:        Night sweats    PHYSICAL EXAMINATION:    BP 120/74   Pulse 61   Ht 5\' 5"  (1.651 m)   Wt 154 lb 9.6 oz (70.1 kg)   BMI 25.73 kg/m     Physical Exam Constitutional:      Appearance: Normal appearance.  Neurological:     General: No focal deficit present.     Mental Status: She is alert.  Psychiatric:        Mood and Affect: Mood normal.     Assessment/Plan: 1. Menopausal symptoms (Primary) - will start HRT - estradiol (VIVELLE-DOT) 0.05 MG/24HR patch; Place 1 patch (0.05 mg total) onto the skin 2 (two) times a week.  Dispense: 8 patch; Refill: 2 - Prometrium 200mg  days 1-15 each month.  Discussed with pt possibility of having vaginal bleeding  2. Decreased libido - Testosterone, Total,  LC/MS/MS  3. Amenorrhea - Follicle stimulating hormone - Estradiol

## 2023-10-20 NOTE — Patient Instructions (Addendum)
 You are going to use the estradiol patch, twice weekly.  You pick the days that work for you for changing the patch.  It's is fine to continue to use the vaginal estradiol cream twice weekly.  I think you will eventually be able to stop this.    The progesterone should be taken at night, days 1-15 each months.  This month, April, start tonight and take for 15 days.    I'm checking some hormonal levels today.

## 2023-10-21 ENCOUNTER — Encounter (HOSPITAL_BASED_OUTPATIENT_CLINIC_OR_DEPARTMENT_OTHER): Payer: Self-pay | Admitting: Obstetrics & Gynecology

## 2023-10-21 LAB — ESTRADIOL: Estradiol: 34.8 pg/mL

## 2023-10-21 LAB — FOLLICLE STIMULATING HORMONE: FSH: 128 m[IU]/mL

## 2023-10-23 DIAGNOSIS — M4723 Other spondylosis with radiculopathy, cervicothoracic region: Secondary | ICD-10-CM | POA: Diagnosis not present

## 2023-10-23 DIAGNOSIS — M4724 Other spondylosis with radiculopathy, thoracic region: Secondary | ICD-10-CM | POA: Diagnosis not present

## 2023-10-23 DIAGNOSIS — M4726 Other spondylosis with radiculopathy, lumbar region: Secondary | ICD-10-CM | POA: Diagnosis not present

## 2023-10-23 DIAGNOSIS — M4728 Other spondylosis with radiculopathy, sacral and sacrococcygeal region: Secondary | ICD-10-CM | POA: Diagnosis not present

## 2023-10-23 LAB — TESTOSTERONE, TOTAL, LC/MS/MS: Testosterone, total: 4.2 ng/dL

## 2023-10-24 DIAGNOSIS — M4723 Other spondylosis with radiculopathy, cervicothoracic region: Secondary | ICD-10-CM | POA: Diagnosis not present

## 2023-10-24 DIAGNOSIS — M4724 Other spondylosis with radiculopathy, thoracic region: Secondary | ICD-10-CM | POA: Diagnosis not present

## 2023-10-24 DIAGNOSIS — M4726 Other spondylosis with radiculopathy, lumbar region: Secondary | ICD-10-CM | POA: Diagnosis not present

## 2023-10-24 DIAGNOSIS — M4728 Other spondylosis with radiculopathy, sacral and sacrococcygeal region: Secondary | ICD-10-CM | POA: Diagnosis not present

## 2023-10-25 DIAGNOSIS — M4724 Other spondylosis with radiculopathy, thoracic region: Secondary | ICD-10-CM | POA: Diagnosis not present

## 2023-10-25 DIAGNOSIS — M4728 Other spondylosis with radiculopathy, sacral and sacrococcygeal region: Secondary | ICD-10-CM | POA: Diagnosis not present

## 2023-10-25 DIAGNOSIS — M4723 Other spondylosis with radiculopathy, cervicothoracic region: Secondary | ICD-10-CM | POA: Diagnosis not present

## 2023-10-25 DIAGNOSIS — M4726 Other spondylosis with radiculopathy, lumbar region: Secondary | ICD-10-CM | POA: Diagnosis not present

## 2023-10-26 DIAGNOSIS — M4723 Other spondylosis with radiculopathy, cervicothoracic region: Secondary | ICD-10-CM | POA: Diagnosis not present

## 2023-10-26 DIAGNOSIS — M4728 Other spondylosis with radiculopathy, sacral and sacrococcygeal region: Secondary | ICD-10-CM | POA: Diagnosis not present

## 2023-10-26 DIAGNOSIS — M4726 Other spondylosis with radiculopathy, lumbar region: Secondary | ICD-10-CM | POA: Diagnosis not present

## 2023-10-26 DIAGNOSIS — M4724 Other spondylosis with radiculopathy, thoracic region: Secondary | ICD-10-CM | POA: Diagnosis not present

## 2023-11-01 DIAGNOSIS — M4726 Other spondylosis with radiculopathy, lumbar region: Secondary | ICD-10-CM | POA: Diagnosis not present

## 2023-11-01 DIAGNOSIS — M4723 Other spondylosis with radiculopathy, cervicothoracic region: Secondary | ICD-10-CM | POA: Diagnosis not present

## 2023-11-01 DIAGNOSIS — M4724 Other spondylosis with radiculopathy, thoracic region: Secondary | ICD-10-CM | POA: Diagnosis not present

## 2023-11-01 DIAGNOSIS — M4728 Other spondylosis with radiculopathy, sacral and sacrococcygeal region: Secondary | ICD-10-CM | POA: Diagnosis not present

## 2023-11-02 DIAGNOSIS — M4723 Other spondylosis with radiculopathy, cervicothoracic region: Secondary | ICD-10-CM | POA: Diagnosis not present

## 2023-11-02 DIAGNOSIS — M4726 Other spondylosis with radiculopathy, lumbar region: Secondary | ICD-10-CM | POA: Diagnosis not present

## 2023-11-02 DIAGNOSIS — M4724 Other spondylosis with radiculopathy, thoracic region: Secondary | ICD-10-CM | POA: Diagnosis not present

## 2023-11-02 DIAGNOSIS — M4728 Other spondylosis with radiculopathy, sacral and sacrococcygeal region: Secondary | ICD-10-CM | POA: Diagnosis not present

## 2023-11-07 DIAGNOSIS — M4724 Other spondylosis with radiculopathy, thoracic region: Secondary | ICD-10-CM | POA: Diagnosis not present

## 2023-11-07 DIAGNOSIS — M4726 Other spondylosis with radiculopathy, lumbar region: Secondary | ICD-10-CM | POA: Diagnosis not present

## 2023-11-07 DIAGNOSIS — M4723 Other spondylosis with radiculopathy, cervicothoracic region: Secondary | ICD-10-CM | POA: Diagnosis not present

## 2023-11-07 DIAGNOSIS — M4728 Other spondylosis with radiculopathy, sacral and sacrococcygeal region: Secondary | ICD-10-CM | POA: Diagnosis not present

## 2023-11-09 ENCOUNTER — Other Ambulatory Visit (HOSPITAL_BASED_OUTPATIENT_CLINIC_OR_DEPARTMENT_OTHER): Payer: Self-pay | Admitting: Obstetrics & Gynecology

## 2023-11-09 DIAGNOSIS — N951 Menopausal and female climacteric states: Secondary | ICD-10-CM

## 2023-11-14 DIAGNOSIS — M4726 Other spondylosis with radiculopathy, lumbar region: Secondary | ICD-10-CM | POA: Diagnosis not present

## 2023-11-14 DIAGNOSIS — M4724 Other spondylosis with radiculopathy, thoracic region: Secondary | ICD-10-CM | POA: Diagnosis not present

## 2023-11-14 DIAGNOSIS — M4723 Other spondylosis with radiculopathy, cervicothoracic region: Secondary | ICD-10-CM | POA: Diagnosis not present

## 2023-11-14 DIAGNOSIS — M4728 Other spondylosis with radiculopathy, sacral and sacrococcygeal region: Secondary | ICD-10-CM | POA: Diagnosis not present

## 2023-11-16 DIAGNOSIS — M4723 Other spondylosis with radiculopathy, cervicothoracic region: Secondary | ICD-10-CM | POA: Diagnosis not present

## 2023-11-16 DIAGNOSIS — M4728 Other spondylosis with radiculopathy, sacral and sacrococcygeal region: Secondary | ICD-10-CM | POA: Diagnosis not present

## 2023-11-16 DIAGNOSIS — M4726 Other spondylosis with radiculopathy, lumbar region: Secondary | ICD-10-CM | POA: Diagnosis not present

## 2023-11-16 DIAGNOSIS — M4724 Other spondylosis with radiculopathy, thoracic region: Secondary | ICD-10-CM | POA: Diagnosis not present

## 2023-11-20 ENCOUNTER — Ambulatory Visit (HOSPITAL_BASED_OUTPATIENT_CLINIC_OR_DEPARTMENT_OTHER): Admitting: Obstetrics & Gynecology

## 2023-11-22 DIAGNOSIS — M4728 Other spondylosis with radiculopathy, sacral and sacrococcygeal region: Secondary | ICD-10-CM | POA: Diagnosis not present

## 2023-11-22 DIAGNOSIS — M4726 Other spondylosis with radiculopathy, lumbar region: Secondary | ICD-10-CM | POA: Diagnosis not present

## 2023-11-22 DIAGNOSIS — M4723 Other spondylosis with radiculopathy, cervicothoracic region: Secondary | ICD-10-CM | POA: Diagnosis not present

## 2023-11-22 DIAGNOSIS — M4724 Other spondylosis with radiculopathy, thoracic region: Secondary | ICD-10-CM | POA: Diagnosis not present

## 2023-11-23 ENCOUNTER — Encounter (HOSPITAL_BASED_OUTPATIENT_CLINIC_OR_DEPARTMENT_OTHER): Payer: Self-pay | Admitting: Obstetrics & Gynecology

## 2023-11-23 ENCOUNTER — Ambulatory Visit (HOSPITAL_BASED_OUTPATIENT_CLINIC_OR_DEPARTMENT_OTHER): Admitting: Obstetrics & Gynecology

## 2023-11-23 VITALS — BP 123/66 | HR 63 | Ht 65.0 in | Wt 156.4 lb

## 2023-11-23 DIAGNOSIS — Z79890 Hormone replacement therapy (postmenopausal): Secondary | ICD-10-CM

## 2023-11-23 NOTE — Progress Notes (Signed)
 GYNECOLOGY  VISIT  CC:   recheck after starting HRT  HPI: 58 y.o. G95P0011 Married White or Caucasian female here for recheck after start HRT with estradiol  patch 0.05mg  patches and progesterone  200mg  days 1-15 each month.  She reports her hot flashes have almost completely stopped.  She does feel her mood is better and that "everyone in my life loves me better".  She is using vaginal estrogen cream because there is still some vaginal discomfort.  She is not have any headaches.  She slept better with the progesterone  but felt a little bit of anxiety with this for a few days.  This resolved.  She noted her sleep was not as good when she stopped it.  She did not have any vaginal bleeding with stopping the progesterone .  Last real bleeding episode was two years ago.     Past Medical History:  Diagnosis Date   Abnormal Pap smear 1/09   AGUS pap   Atypical nevus 01/28/2009   Right Upper Back -Moderate   Atypical nevus 05/04/2010   Right Low Paraspinal-Minimal, Left Mid Paraspinal-Mild to Moderate and Right Upper Paraspinal-Minimal   Atypical nevus 11/30/2011   Right Upper Paraspinal-Mild to Moderate   Atypical nevus 12/03/2013   Right Upper Back-Mild, and Right Lower Scapula-Moderate(widershave)   Atypical nevus 06/07/2016   Right Post Shoulder-Mild   GERD (gastroesophageal reflux disease)    H/O: C-section    IBS (irritable bowel syndrome)    ?    MEDS:   Current Outpatient Medications on File Prior to Visit  Medication Sig Dispense Refill   Azelastine HCl (ASTELIN NA) Place into the nose daily.      Cholecalciferol (VITAMIN D  PO) Take 2,000 Units by mouth daily.      estradiol  (ESTRACE ) 0.1 MG/GM vaginal cream 1 gram vaginally twice weekly 42.5 g 2   estradiol  (VIVELLE -DOT) 0.05 MG/24HR patch PLACE 1 PATCH (0.05 MG TOTAL) ONTO THE SKIN 2 (TWO) TIMES A WEEK. 24 patch 1   famotidine (PEPCID) 20 MG tablet Take 20 mg by mouth at bedtime.     fexofenadine (ALLEGRA) 60 MG tablet Take 60 mg  by mouth daily.     fluticasone (FLONASE) 50 MCG/ACT nasal spray Place 2 sprays into the nose daily.     Magnesium 200 MG TABS Take 200 mg by mouth daily.     omeprazole (PRILOSEC) 20 MG capsule Take 20 mg by mouth daily.     PROAIR HFA 108 (90 Base) MCG/ACT inhaler      Probiotic Product (SOLUBLE FIBER/PROBIOTICS PO) Take by mouth daily.     progesterone  (PROMETRIUM ) 200 MG capsule Take days 1-15 each month 45 capsule 0   No current facility-administered medications on file prior to visit.    ALLERGIES: Amoxicillin-pot clavulanate, Ciprofloxacin, Shellfish allergy, and Sulfa antibiotics  SH:  married, non smoker  Review of Systems  Constitutional: Negative.   Genitourinary: Negative.     PHYSICAL EXAMINATION:    BP 123/66 (BP Location: Right Arm, Patient Position: Sitting)   Pulse 63   Ht 5\' 5"  (1.651 m)   Wt 156 lb 6.4 oz (70.9 kg)   BMI 26.03 kg/m     Physical Exam Constitutional:      Appearance: Normal appearance.  Neurological:     General: No focal deficit present.     Mental Status: She is alert.  Psychiatric:        Mood and Affect: Mood normal.    Assessment/Plan: 1. Hormone replacement therapy (HRT) (Primary) -  will continue estradiol  patch 0.05mg  patches twice weekly.  Does not need RFs at this time.   - for the next two moths, will continue progesterone  days 1-15 each month.  Will need RF in June.  Pt did not have any bleeding last month.  We discussed decreasing to the 100mg  dosing (or 200mg ) nightly if does not have any bleeding.   - at this point, do not recommend any testosterone  and also do not feel she needs any additional blood work.    Total time with pt:  24 mintues

## 2023-11-30 DIAGNOSIS — M4723 Other spondylosis with radiculopathy, cervicothoracic region: Secondary | ICD-10-CM | POA: Diagnosis not present

## 2023-11-30 DIAGNOSIS — M4728 Other spondylosis with radiculopathy, sacral and sacrococcygeal region: Secondary | ICD-10-CM | POA: Diagnosis not present

## 2023-11-30 DIAGNOSIS — M4726 Other spondylosis with radiculopathy, lumbar region: Secondary | ICD-10-CM | POA: Diagnosis not present

## 2023-11-30 DIAGNOSIS — M4724 Other spondylosis with radiculopathy, thoracic region: Secondary | ICD-10-CM | POA: Diagnosis not present

## 2023-12-14 DIAGNOSIS — M4728 Other spondylosis with radiculopathy, sacral and sacrococcygeal region: Secondary | ICD-10-CM | POA: Diagnosis not present

## 2023-12-14 DIAGNOSIS — M4724 Other spondylosis with radiculopathy, thoracic region: Secondary | ICD-10-CM | POA: Diagnosis not present

## 2023-12-14 DIAGNOSIS — M4726 Other spondylosis with radiculopathy, lumbar region: Secondary | ICD-10-CM | POA: Diagnosis not present

## 2023-12-14 DIAGNOSIS — M4723 Other spondylosis with radiculopathy, cervicothoracic region: Secondary | ICD-10-CM | POA: Diagnosis not present

## 2023-12-21 DIAGNOSIS — M4723 Other spondylosis with radiculopathy, cervicothoracic region: Secondary | ICD-10-CM | POA: Diagnosis not present

## 2023-12-21 DIAGNOSIS — M4728 Other spondylosis with radiculopathy, sacral and sacrococcygeal region: Secondary | ICD-10-CM | POA: Diagnosis not present

## 2023-12-21 DIAGNOSIS — M4726 Other spondylosis with radiculopathy, lumbar region: Secondary | ICD-10-CM | POA: Diagnosis not present

## 2023-12-21 DIAGNOSIS — M4724 Other spondylosis with radiculopathy, thoracic region: Secondary | ICD-10-CM | POA: Diagnosis not present

## 2023-12-31 ENCOUNTER — Encounter (HOSPITAL_BASED_OUTPATIENT_CLINIC_OR_DEPARTMENT_OTHER): Payer: Self-pay | Admitting: Obstetrics & Gynecology

## 2024-01-08 DIAGNOSIS — M4726 Other spondylosis with radiculopathy, lumbar region: Secondary | ICD-10-CM | POA: Diagnosis not present

## 2024-01-08 DIAGNOSIS — M4724 Other spondylosis with radiculopathy, thoracic region: Secondary | ICD-10-CM | POA: Diagnosis not present

## 2024-01-08 DIAGNOSIS — M4728 Other spondylosis with radiculopathy, sacral and sacrococcygeal region: Secondary | ICD-10-CM | POA: Diagnosis not present

## 2024-01-08 DIAGNOSIS — M4723 Other spondylosis with radiculopathy, cervicothoracic region: Secondary | ICD-10-CM | POA: Diagnosis not present

## 2024-01-10 ENCOUNTER — Other Ambulatory Visit (HOSPITAL_BASED_OUTPATIENT_CLINIC_OR_DEPARTMENT_OTHER): Payer: Self-pay | Admitting: Obstetrics & Gynecology

## 2024-01-11 DIAGNOSIS — M4726 Other spondylosis with radiculopathy, lumbar region: Secondary | ICD-10-CM | POA: Diagnosis not present

## 2024-01-11 DIAGNOSIS — M4728 Other spondylosis with radiculopathy, sacral and sacrococcygeal region: Secondary | ICD-10-CM | POA: Diagnosis not present

## 2024-01-11 DIAGNOSIS — M4724 Other spondylosis with radiculopathy, thoracic region: Secondary | ICD-10-CM | POA: Diagnosis not present

## 2024-01-11 DIAGNOSIS — M4723 Other spondylosis with radiculopathy, cervicothoracic region: Secondary | ICD-10-CM | POA: Diagnosis not present

## 2024-01-31 ENCOUNTER — Other Ambulatory Visit (HOSPITAL_BASED_OUTPATIENT_CLINIC_OR_DEPARTMENT_OTHER): Payer: Self-pay | Admitting: Obstetrics & Gynecology

## 2024-01-31 MED ORDER — ESTRADIOL 0.0375 MG/24HR TD PTTW: 1.0000 | MEDICATED_PATCH | TRANSDERMAL | 2 refills | Status: DC

## 2024-01-31 NOTE — Telephone Encounter (Signed)
 Called pt as I wasn't completely clear about her message.  When she was on the 0.05mg  dosage, she was having more hormonal symptoms.  At the 0.025mg  dosage, she is having more postmenopausal symptoms with dryness/itching, hot flashes.  She is going to try the dosage in between -- the 0.0375mg  patches.  Will continue with progesterone  200mg  day 1-15 each month.  New rx to pharmacy.  Questions answered.

## 2024-02-19 ENCOUNTER — Other Ambulatory Visit (HOSPITAL_BASED_OUTPATIENT_CLINIC_OR_DEPARTMENT_OTHER): Payer: Self-pay | Admitting: Obstetrics & Gynecology

## 2024-02-19 DIAGNOSIS — N952 Postmenopausal atrophic vaginitis: Secondary | ICD-10-CM

## 2024-02-21 ENCOUNTER — Other Ambulatory Visit (HOSPITAL_BASED_OUTPATIENT_CLINIC_OR_DEPARTMENT_OTHER): Payer: Self-pay | Admitting: Obstetrics & Gynecology

## 2024-02-22 NOTE — Telephone Encounter (Signed)
 Called patient, [facility] for patient to call office. Would like to know if patient requested a 90day supply. tbw

## 2024-02-27 ENCOUNTER — Other Ambulatory Visit (HOSPITAL_BASED_OUTPATIENT_CLINIC_OR_DEPARTMENT_OTHER): Payer: Self-pay

## 2024-03-14 ENCOUNTER — Encounter (HOSPITAL_BASED_OUTPATIENT_CLINIC_OR_DEPARTMENT_OTHER): Payer: Self-pay | Admitting: Obstetrics & Gynecology

## 2024-03-15 ENCOUNTER — Other Ambulatory Visit (HOSPITAL_BASED_OUTPATIENT_CLINIC_OR_DEPARTMENT_OTHER): Payer: Self-pay

## 2024-03-15 MED ORDER — PROGESTERONE MICRONIZED 100 MG PO CAPS: 100.0000 mg | ORAL_CAPSULE | Freq: Every day | ORAL | 1 refills | Status: DC

## 2024-05-20 DIAGNOSIS — Z13220 Encounter for screening for lipoid disorders: Secondary | ICD-10-CM | POA: Diagnosis not present

## 2024-05-20 DIAGNOSIS — R52 Pain, unspecified: Secondary | ICD-10-CM | POA: Diagnosis not present

## 2024-05-20 DIAGNOSIS — Z0000 Encounter for general adult medical examination without abnormal findings: Secondary | ICD-10-CM | POA: Diagnosis not present

## 2024-05-20 DIAGNOSIS — Z1329 Encounter for screening for other suspected endocrine disorder: Secondary | ICD-10-CM | POA: Diagnosis not present

## 2024-05-22 DIAGNOSIS — L290 Pruritus ani: Secondary | ICD-10-CM | POA: Diagnosis not present

## 2024-05-29 DIAGNOSIS — J0140 Acute pansinusitis, unspecified: Secondary | ICD-10-CM | POA: Diagnosis not present

## 2024-06-19 ENCOUNTER — Encounter (HOSPITAL_BASED_OUTPATIENT_CLINIC_OR_DEPARTMENT_OTHER): Payer: Self-pay | Admitting: Obstetrics & Gynecology

## 2024-06-19 ENCOUNTER — Ambulatory Visit (HOSPITAL_BASED_OUTPATIENT_CLINIC_OR_DEPARTMENT_OTHER): Admitting: Obstetrics & Gynecology

## 2024-06-19 VITALS — BP 109/72 | HR 65 | Ht 65.0 in | Wt 152.0 lb

## 2024-06-19 DIAGNOSIS — Z79890 Hormone replacement therapy (postmenopausal): Secondary | ICD-10-CM

## 2024-06-19 DIAGNOSIS — D251 Intramural leiomyoma of uterus: Secondary | ICD-10-CM | POA: Diagnosis not present

## 2024-06-19 DIAGNOSIS — Z01419 Encounter for gynecological examination (general) (routine) without abnormal findings: Secondary | ICD-10-CM

## 2024-06-19 DIAGNOSIS — Z1331 Encounter for screening for depression: Secondary | ICD-10-CM | POA: Diagnosis not present

## 2024-06-19 DIAGNOSIS — I4510 Unspecified right bundle-branch block: Secondary | ICD-10-CM

## 2024-06-19 DIAGNOSIS — D252 Subserosal leiomyoma of uterus: Secondary | ICD-10-CM

## 2024-06-19 NOTE — Progress Notes (Signed)
 ANNUAL EXAM Patient name: Laurie Flores MRN [id number]  Date of birth: 14-Oct-1965 Chief Complaint:   Gynecologic Exam  History of Present Illness:   Laurie Flores is a 58 y.o. G20P0011 Caucasian female being seen today for a routine annual exam.  Denies vaginal bleeding.  Is on HRT and sleep is improved.  Wakes up still once a night around 2-3am.  Not having hot flashes.  Has some heat intolerance.  Mood is improved but she is still feeling some edginess to some people or in some situations.  Using vaginal estradiol .  We discussed adjusting progesterone  dosing.   RLQ pain has been improved but she has felt this some in the last few weeks.    Unrelated, having some increased rectal itching and saw GI.  Has hemorrhoids.  She is using diltiazem cream.    LMP:  10/2023.  This occurred after using first dosage of progesterone .  Last pap: 01/05/2022. Results were: NILM w/ HRHPV negative. H/O abnormal pap: yes Last mammogram: 02/17/2023. Results were: normal. Patient reports that she needs to schedule the one for this year. Family h/o breast cancer: no Last colonoscopy: 07/26/2022. Results were: normal. Family h/o colorectal cancer: no DEXA:  discussed screening timing today.     06/19/2024    8:21 AM 11/23/2023    2:28 PM 02/28/2023    3:20 PM 05/03/2022   10:29 AM 01/05/2022    9:20 AM  Depression screen PHQ 2/9  Decreased Interest 0 0 0 0 0  Down, Depressed, Hopeless 0 0 0 0 0  PHQ - 2 Score 0 0 0 0 0    Review of Systems:   Pertinent items are noted in HPI Denies any urinary or bowel changes.  Denies pelvic pain.   Pertinent History Reviewed:  Reviewed past medical,surgical, social and family history.  Reviewed problem list, medications and allergies. Physical Assessment:   Vitals:   06/19/24 0820  BP: 109/72  Pulse: 65  SpO2: 100%  Weight: 152 lb (68.9 kg)  Height: 5' 5 (1.651 m)  Body mass index is 25.29 kg/m.        Physical Examination:   General appearance -  well appearing, and in no distress  Mental status - alert, oriented to person, place, and time  Psych:  She has a normal mood and affect  Skin - warm and dry, normal color, no suspicious lesions noted  Chest - effort normal, all lung fields clear to auscultation bilaterally  Heart - normal rate and regular rhythm  Neck:  midline trachea, no thyromegaly or nodules  Breasts - breasts appear normal, no suspicious masses, no skin or nipple changes or  axillary nodes  Abdomen - soft, nontender, nondistended, no masses or organomegaly  Pelvic - VULVA: normal appearing vulva with no masses, tenderness or lesions   VAGINA: normal appearing vagina with normal color and discharge, no lesions   CERVIX: normal appearing cervix without discharge or lesions, no CMT  Thin prep pap is not indicated today  UTERUS: uterus is felt to be normal size, shape, consistency and nontender   ADNEXA: No adnexal masses or tenderness noted.  Rectal - normal rectal, good sphincter tone, no masses felt.   Extremities:  No swelling or varicosities noted  Chaperone present for exam  No results found for this or any previous visit (from the past 24 hours).  Assessment & Plan:  1. Well woman exam with routine gynecological exam (Primary) - Pap smear 2023.  Not indicated today. -  Mammogram 2024.  She is aware and will call to schedule. - Colonoscopy 2024 - Bone mineral density not indicated - lab work done with PCP - vaccines reviewed/updated  2. Hormone replacement therapy (HRT) - continue vivelle  dot 0.0375mg  patches twice.  RF not needed at this time. - she is going to increase progesterone  to 200mg  nighty.  She has this dosage at home and doesn't need RF at this time  3. Intramural and subserous leiomyoma of uterus  4. Right bundle branch block - was released from cardiology   No orders of the defined types were placed in this encounter.   Meds: No orders of the defined types were placed in this  encounter.   Follow-up: Return in about 1 year (around 06/19/2025).  Ronal GORMAN Pinal, MD 06/19/2024 9:28 AM

## 2024-06-20 ENCOUNTER — Other Ambulatory Visit (HOSPITAL_BASED_OUTPATIENT_CLINIC_OR_DEPARTMENT_OTHER): Payer: Self-pay

## 2024-06-21 ENCOUNTER — Other Ambulatory Visit (HOSPITAL_BASED_OUTPATIENT_CLINIC_OR_DEPARTMENT_OTHER): Payer: Self-pay | Admitting: Obstetrics & Gynecology

## 2024-06-21 MED ORDER — HYDROCORTISONE (PERIANAL) 2.5 % EX CREA: TOPICAL_CREAM | Freq: Four times a day (QID) | CUTANEOUS | 1 refills | Status: AC | PRN

## 2024-07-02 ENCOUNTER — Encounter: Payer: Self-pay | Admitting: Dermatology

## 2024-07-02 ENCOUNTER — Ambulatory Visit: Payer: BC Managed Care – PPO | Admitting: Dermatology

## 2024-07-02 VITALS — BP 96/66

## 2024-07-02 DIAGNOSIS — L578 Other skin changes due to chronic exposure to nonionizing radiation: Secondary | ICD-10-CM

## 2024-07-02 DIAGNOSIS — D229 Melanocytic nevi, unspecified: Secondary | ICD-10-CM

## 2024-07-02 DIAGNOSIS — L814 Other melanin hyperpigmentation: Secondary | ICD-10-CM | POA: Diagnosis not present

## 2024-07-02 DIAGNOSIS — D2362 Other benign neoplasm of skin of left upper limb, including shoulder: Secondary | ICD-10-CM

## 2024-07-02 DIAGNOSIS — D1801 Hemangioma of skin and subcutaneous tissue: Secondary | ICD-10-CM | POA: Diagnosis not present

## 2024-07-02 DIAGNOSIS — Z1283 Encounter for screening for malignant neoplasm of skin: Secondary | ICD-10-CM | POA: Diagnosis not present

## 2024-07-02 DIAGNOSIS — W908XXA Exposure to other nonionizing radiation, initial encounter: Secondary | ICD-10-CM

## 2024-07-02 DIAGNOSIS — L821 Other seborrheic keratosis: Secondary | ICD-10-CM

## 2024-07-02 DIAGNOSIS — L720 Epidermal cyst: Secondary | ICD-10-CM | POA: Diagnosis not present

## 2024-07-02 NOTE — Progress Notes (Signed)
° °  Total Body Skin Exam (TBSE) Visit   Subjective  Laurie Flores is a 58 y.o. female ESTABLISHED PATIENT who presents for the following:  Patient (and/or pt guardian) consented to the use of AI-assisted tools for note generation.  Total Body Skin Exam (TBSE)  Patient was last evaluated for TBSE on 07/05/23 .  Patient does have spots of concern to be evaluated - she believes are milia on her face. She does apply sunscreen and/or wears protective coverings. Hx of Bx - benign. No family Hx of skin cancers.   The patient has spots, moles and lesions to be evaluated, some may be new or changing and the patient has concerns that these could be cancer.  The following portions of the chart were reviewed this encounter and updated as appropriate: medications, allergies, medical history  Review of Systems:  No other skin or systemic complaints except as noted in HPI or Assessment and Plan.  Objective  Well appearing patient in no apparent distress; mood and affect are within normal limits.  A full examination was performed including scalp, head, eyes, ears, nose, lips, neck, chest, axillae, abdomen, back, buttocks, bilateral upper extremities, bilateral lower extremities, hands, feet, fingers, toes, fingernails, and toenails. All findings within normal limits unless otherwise noted below.   Relevant physical exam findings are noted in the Assessment and Plan.   Assessment & Plan   LENTIGINES, SEBORRHEIC KERATOSES, HEMANGIOMAS - Benign normal skin lesions - Benign-appearing - Call for any changes  BENIGN MELANOCYTIC NEVI - Tan-brown and/or pink-flesh-colored symmetric macules and papules - Benign appearing on exam today - Observation - Call clinic for new or changing moles - Recommend daily use of broad spectrum spf 30+ sunscreen to sun-exposed areas.   MILD ACTINIC DAMAGE - Chronic condition, secondary to cumulative UV/sun exposure - diffuse scaly erythematous macules with  underlying dyspigmentation - Recommend daily broad spectrum sunscreen SPF 30+ to sun-exposed areas, reapply every 2 hours as needed.  - Staying in the shade or wearing long sleeves, sun glasses (UVA+UVB protection) and wide brim hats (4-inch brim around the entire circumference of the hat) are also recommended for sun protection.  - Call for new or changing lesions.  DERMATOFIBROMA Exam: Firm pink/brown papulenodule with dimple sign L upper arm  Treatment Plan: A dermatofibroma is a benign growth possibly related to trauma, such as an insect bite, cut from shaving, or inflamed acne-type bump.  Treatment options to remove include shave or excision with resulting scar and risk of recurrence.  Since benign-appearing and not bothersome, will observe for now.   Milia - tiny firm white papules - type of cyst - benign - sometimes these will clear with nightly OTC adapalene/Differin 0.1% gel or retinol. - may be extracted if symptomatic - observe  SKIN CANCER SCREENING PERFORMED TODAY.  Return in about 1 year (around 07/02/2025) for TBSE.   Documentation: I have reviewed the above documentation for accuracy and completeness, and I agree with the above.  I, Jya Hughston Maranda, CMA II, am acting as scribe for:  Delon Lenis, DO

## 2024-07-02 NOTE — Patient Instructions (Addendum)
 Cosmetic Removal Quote:  $200 to remove up to 15 lesions $300 to remove 16 to 25 lesions $400 to removal 26 to 35 lesions $10 per each additional lesion over 35  ** $100 deposit required to schedule **   Important Information  Due to recent changes in healthcare laws, you may see results of your pathology and/or laboratory studies on MyChart before the doctors have had a chance to review them. We understand that in some cases there may be results that are confusing or concerning to you. Please understand that not all results are received at the same time and often the doctors may need to interpret multiple results in order to provide you with the best plan of care or course of treatment. Therefore, we ask that you please give us  2 business days to thoroughly review all your results before contacting the office for clarification. Should we see a critical lab result, you will be contacted sooner.   If You Need Anything After Your Visit  If you have any questions or concerns for your doctor, please call our main line at [phone] If no one answers, please leave a voicemail as directed and we will return your call as soon as possible. Messages left after 4 pm will be answered the following business day.   You may also send us  a message via MyChart. We typically respond to MyChart messages within 1-2 business days.  For prescription refills, please ask your pharmacy to contact our office. Our fax number is [phone].  If you have an urgent issue when the clinic is closed that cannot wait until the next business day, you can page your doctor at the number below.    Please note that while we do our best to be available for urgent issues outside of office hours, we are not available 24/7.   If you have an urgent issue and are unable to reach us , you may choose to seek medical care at your doctor's office, retail clinic, urgent care center, or emergency room.  If you have a medical  emergency, please immediately call 911 or go to the emergency department. In the event of inclement weather, please call our main line at [phone] for an update on the status of any delays or closures.  Dermatology Medication Tips: Please keep the boxes that topical medications come in in order to help keep track of the instructions about where and how to use these. Pharmacies typically print the medication instructions only on the boxes and not directly on the medication tubes.   If your medication is too expensive, please contact our office at [phone] or send us  a message through MyChart.   We are unable to tell what your co-pay for medications will be in advance as this is different depending on your insurance coverage. However, we may be able to find a substitute medication at lower cost or fill out paperwork to get insurance to cover a needed medication.   If a prior authorization is required to get your medication covered by your insurance company, please allow us  1-2 business days to complete this process.  Drug prices often vary depending on where the prescription is filled and some pharmacies may offer cheaper prices.  The website www.goodrx.com contains coupons for medications through different pharmacies. The prices here do not account for what the cost may be with help from insurance (it may be cheaper with your insurance), but the website can give you the price if you did not use  any insurance.  - You can print the associated coupon and take it with your prescription to the pharmacy.  - You may also stop by our office during regular business hours and pick up a GoodRx coupon card.  - If you need your prescription sent electronically to a different pharmacy, notify our office through [facility] or by phone at [phone]

## 2024-07-04 DIAGNOSIS — J0141 Acute recurrent pansinusitis: Secondary | ICD-10-CM | POA: Diagnosis not present

## 2024-07-15 DIAGNOSIS — H1045 Other chronic allergic conjunctivitis: Secondary | ICD-10-CM | POA: Diagnosis not present

## 2024-07-15 DIAGNOSIS — H04123 Dry eye syndrome of bilateral lacrimal glands: Secondary | ICD-10-CM | POA: Diagnosis not present

## 2024-07-15 DIAGNOSIS — H43812 Vitreous degeneration, left eye: Secondary | ICD-10-CM | POA: Diagnosis not present

## 2024-07-17 ENCOUNTER — Other Ambulatory Visit (HOSPITAL_BASED_OUTPATIENT_CLINIC_OR_DEPARTMENT_OTHER): Payer: Self-pay

## 2024-07-17 MED ORDER — PROGESTERONE 200 MG PO CAPS: 200.0000 mg | ORAL_CAPSULE | Freq: Every day | ORAL | 1 refills | Status: AC

## 2024-07-17 NOTE — Telephone Encounter (Signed)
 Patient called in stating she has finished all the Progesterone  100mg  that she had at home. She would like to have a Rx called in for Progesterone  200mg  daily. Rx has been sent.

## 2024-08-01 ENCOUNTER — Ambulatory Visit (INDEPENDENT_AMBULATORY_CARE_PROVIDER_SITE_OTHER): Admitting: Otolaryngology

## 2024-08-01 VITALS — BP 98/66 | HR 66

## 2024-08-01 DIAGNOSIS — J324 Chronic pansinusitis: Secondary | ICD-10-CM | POA: Insufficient documentation

## 2024-08-01 DIAGNOSIS — J329 Chronic sinusitis, unspecified: Secondary | ICD-10-CM | POA: Diagnosis not present

## 2024-08-01 DIAGNOSIS — J343 Hypertrophy of nasal turbinates: Secondary | ICD-10-CM | POA: Insufficient documentation

## 2024-08-01 NOTE — Progress Notes (Signed)
 Patient ID: Laurie Flores, female   DOB: 11/24/1965, 59 y.o.   MRN: [id number]  Follow up: Chronic rhinosinusitis, chronic nasal obstruction  History of Present Illness Laurie Flores is a 59 year old female with chronic sinusitis who presents with persistent facial pain, pressure, and nasal congestion.  Since October, she has experienced continuous facial pain and pressure localized to the sinus region, accompanied by a persistent sensation of swelling and nasal congestion. She describes chronic nasal obstruction with intermittent difficulty breathing through her nose.  She was initially diagnosed with a sinus infection by her primary care provider and treated with multiple courses of antibiotics, including Augmentin and doxycycline, with partial improvement on doxycycline. A prednisone injection provided significant relief of sinus pressure, but her symptoms have persisted.  She remains adherent to Flonase nasal spray, one spray in each nostril twice daily. She has a history of bilateral inferior turbinate hypertrophy, and she continues to experience facial pain and congestion despite ongoing medical therapy.  Exam: General: Communicates without difficulty, well nourished, no acute distress. Head: Normocephalic, no evidence injury, no tenderness, facial buttresses intact without stepoff. Face/sinus: No tenderness to palpation and percussion. Facial movement is normal and symmetric. Eyes: PERRL, EOMI. No scleral icterus, conjunctivae clear. Neuro: CN II exam reveals vision grossly intact.  No nystagmus at any point of gaze. Ears: Auricles well formed without lesions.  Ear canals are intact without mass or lesion.  No erythema or edema is appreciated.  The TMs are intact without fluid. Nose: External evaluation reveals normal support and skin without lesions.  Dorsum is intact.  Anterior rhinoscopy reveals congested mucosa over anterior aspect of inferior turbinates and intact septum.  No purulence  noted. Oral:  Oral cavity and oropharynx are intact, symmetric, without erythema or edema.  Mucosa is moist without lesions. Neck: Full range of motion without pain.  There is no significant lymphadenopathy.  No masses palpable.  Thyroid  bed within normal limits to palpation.  Parotid glands and submandibular glands equal bilaterally without mass.  Trachea is midline. Neuro:  CN 2-12 grossly intact.    Assessment and Plan Assessment & Plan Chronic sinusitis, with recurrent exacerbations Chronic, refractory sinusitis with persistent facial pain, pressure, and congestion since October despite multiple courses of antibiotics, prednisone, and ongoing daily intranasal corticosteroids. Examination demonstrated turbinate hypertrophy and chronic nasal congestion without evidence of acute infection. Further evaluation of sinus anatomy and disease extent is necessary to guide definitive management. - Ordered CT scan of the sinuses to assess anatomy and disease extent. - Advised continuation of daily intranasal corticosteroid (Flonase). - Scheduled follow-up in 3-4 weeks to review CT results and discuss further management.

## 2024-08-06 ENCOUNTER — Ambulatory Visit

## 2024-08-06 DIAGNOSIS — J329 Chronic sinusitis, unspecified: Secondary | ICD-10-CM | POA: Diagnosis not present

## 2024-08-08 ENCOUNTER — Encounter (HOSPITAL_BASED_OUTPATIENT_CLINIC_OR_DEPARTMENT_OTHER): Payer: Self-pay | Admitting: Obstetrics & Gynecology

## 2024-08-09 ENCOUNTER — Other Ambulatory Visit (HOSPITAL_BASED_OUTPATIENT_CLINIC_OR_DEPARTMENT_OTHER): Payer: Self-pay | Admitting: Certified Nurse Midwife

## 2024-08-14 ENCOUNTER — Other Ambulatory Visit (HOSPITAL_BASED_OUTPATIENT_CLINIC_OR_DEPARTMENT_OTHER): Payer: Self-pay

## 2024-08-14 DIAGNOSIS — Z79890 Hormone replacement therapy (postmenopausal): Secondary | ICD-10-CM

## 2024-08-14 MED ORDER — ESTRADIOL 0.0375 MG/24HR TD PTTW: 1.0000 | MEDICATED_PATCH | TRANSDERMAL | 0 refills | Status: AC

## 2024-08-14 MED ORDER — ESTRADIOL 0.05 MG/24HR TD PTWK: 0.0500 mg | MEDICATED_PATCH | TRANSDERMAL | 3 refills | Status: DC

## 2024-08-20 ENCOUNTER — Other Ambulatory Visit (HOSPITAL_BASED_OUTPATIENT_CLINIC_OR_DEPARTMENT_OTHER): Payer: Self-pay

## 2024-08-20 DIAGNOSIS — Z79890 Hormone replacement therapy (postmenopausal): Secondary | ICD-10-CM

## 2024-08-20 MED ORDER — ESTRADIOL 0.05 MG/24HR TD PTWK: 0.0500 mg | MEDICATED_PATCH | TRANSDERMAL | 3 refills | Status: AC

## 2024-08-26 ENCOUNTER — Ambulatory Visit (INDEPENDENT_AMBULATORY_CARE_PROVIDER_SITE_OTHER): Admitting: Otolaryngology

## 2024-11-12 ENCOUNTER — Ambulatory Visit (INDEPENDENT_AMBULATORY_CARE_PROVIDER_SITE_OTHER): Payer: Self-pay | Admitting: Dermatology
